# Patient Record
Sex: Male | Born: 1945 | Race: White | Hispanic: No | Marital: Married | Smoking: Former smoker
Health system: Southern US, Community
[De-identification: ages and names within clinical notes are randomized; demographics above are authoritative.]

## PROBLEM LIST (undated history)

## (undated) DIAGNOSIS — J449 Chronic obstructive pulmonary disease, unspecified: Secondary | ICD-10-CM

## (undated) DIAGNOSIS — Z8739 Personal history of other diseases of the musculoskeletal system and connective tissue: Secondary | ICD-10-CM

## (undated) DIAGNOSIS — M545 Low back pain, unspecified: Secondary | ICD-10-CM

## (undated) DIAGNOSIS — G8929 Other chronic pain: Secondary | ICD-10-CM

## (undated) DIAGNOSIS — I10 Essential (primary) hypertension: Secondary | ICD-10-CM

## (undated) DIAGNOSIS — K219 Gastro-esophageal reflux disease without esophagitis: Secondary | ICD-10-CM

## (undated) DIAGNOSIS — J189 Pneumonia, unspecified organism: Secondary | ICD-10-CM

## (undated) DIAGNOSIS — M1990 Unspecified osteoarthritis, unspecified site: Secondary | ICD-10-CM

## (undated) HISTORY — PX: JOINT REPLACEMENT: SHX530

---

## 1999-06-12 ENCOUNTER — Encounter: Payer: Self-pay | Admitting: Orthopedic Surgery

## 1999-06-12 ENCOUNTER — Ambulatory Visit (HOSPITAL_COMMUNITY): Admission: RE | Admit: 1999-06-12 | Discharge: 1999-06-12 | Payer: Self-pay | Admitting: Orthopedic Surgery

## 2004-09-27 ENCOUNTER — Emergency Department (HOSPITAL_COMMUNITY): Admission: EM | Admit: 2004-09-27 | Discharge: 2004-09-27 | Payer: Self-pay | Admitting: Family Medicine

## 2014-05-25 ENCOUNTER — Other Ambulatory Visit: Payer: Self-pay | Admitting: Gastroenterology

## 2014-06-06 ENCOUNTER — Encounter (HOSPITAL_COMMUNITY): Payer: Self-pay | Admitting: *Deleted

## 2014-06-26 ENCOUNTER — Ambulatory Visit (HOSPITAL_COMMUNITY): Payer: PPO | Admitting: Anesthesiology

## 2014-06-26 ENCOUNTER — Ambulatory Visit (HOSPITAL_COMMUNITY)
Admission: RE | Admit: 2014-06-26 | Discharge: 2014-06-26 | Disposition: A | Discharge status: Home / Self Care | Payer: PPO | Source: Ambulatory Visit | Attending: Gastroenterology | Admitting: Gastroenterology

## 2014-06-26 ENCOUNTER — Encounter (HOSPITAL_COMMUNITY): Payer: Self-pay | Admitting: Gastroenterology

## 2014-06-26 ENCOUNTER — Encounter (HOSPITAL_COMMUNITY)
Admission: RE | Disposition: A | Discharge status: Home / Self Care | Payer: Self-pay | Source: Ambulatory Visit | Attending: Gastroenterology

## 2014-06-26 DIAGNOSIS — M1990 Unspecified osteoarthritis, unspecified site: Secondary | ICD-10-CM | POA: Diagnosis not present

## 2014-06-26 DIAGNOSIS — I10 Essential (primary) hypertension: Secondary | ICD-10-CM | POA: Diagnosis not present

## 2014-06-26 DIAGNOSIS — J449 Chronic obstructive pulmonary disease, unspecified: Secondary | ICD-10-CM | POA: Diagnosis not present

## 2014-06-26 DIAGNOSIS — Z1211 Encounter for screening for malignant neoplasm of colon: Secondary | ICD-10-CM | POA: Insufficient documentation

## 2014-06-26 DIAGNOSIS — M109 Gout, unspecified: Secondary | ICD-10-CM | POA: Diagnosis not present

## 2014-06-26 DIAGNOSIS — Z87891 Personal history of nicotine dependence: Secondary | ICD-10-CM | POA: Insufficient documentation

## 2014-06-26 DIAGNOSIS — J45909 Unspecified asthma, uncomplicated: Secondary | ICD-10-CM | POA: Diagnosis not present

## 2014-06-26 HISTORY — DX: Gastro-esophageal reflux disease without esophagitis: K21.9

## 2014-06-26 HISTORY — DX: Essential (primary) hypertension: I10

## 2014-06-26 HISTORY — PX: COLONOSCOPY WITH PROPOFOL: SHX5780

## 2014-06-26 HISTORY — DX: Chronic obstructive pulmonary disease, unspecified: J44.9

## 2014-06-26 HISTORY — DX: Unspecified osteoarthritis, unspecified site: M19.90

## 2014-06-26 SURGERY — COLONOSCOPY WITH PROPOFOL
Anesthesia: Monitor Anesthesia Care

## 2014-06-26 MED ORDER — PROPOFOL 10 MG/ML IV BOLUS
INTRAVENOUS | Status: AC
  Filled 2014-06-26: qty 20

## 2014-06-26 MED ORDER — FENTANYL CITRATE 0.05 MG/ML IJ SOLN: 25.0000 ug | INTRAMUSCULAR | Status: DC | PRN

## 2014-06-26 MED ORDER — PROPOFOL INFUSION 10 MG/ML OPTIME
INTRAVENOUS | Status: DC | PRN
  Administered 2014-06-26: 300 ug/kg/min via INTRAVENOUS

## 2014-06-26 MED ORDER — LACTATED RINGERS IV SOLN
INTRAVENOUS | Status: DC
  Administered 2014-06-26: 1000 mL via INTRAVENOUS

## 2014-06-26 MED ORDER — SODIUM CHLORIDE 0.9 % IV SOLN: INTRAVENOUS | Status: DC

## 2014-06-26 SURGICAL SUPPLY — 21 items

## 2014-06-26 NOTE — Discharge Instructions (Signed)
Conscious Sedation, Adult, Care After °Refer to this sheet in the next few weeks. These instructions provide you with information on caring for yourself after your procedure. Your health care provider may also give you more specific instructions. Your treatment has been planned according to current medical practices, but problems sometimes occur. Call your health care provider if you have any problems or questions after your procedure. °WHAT TO EXPECT AFTER THE PROCEDURE  °After your procedure: °You may feel sleepy, clumsy, and have poor balance for several hours. °Vomiting may occur if you eat too soon after the procedure. °HOME CARE INSTRUCTIONS °Do not participate in any activities where you could become injured for at least 24 hours. Do not: °Drive. °Swim. °Ride a bicycle. °Operate heavy machinery. °Cook. °Use power tools. °Climb ladders. °Work from a high place. °Do not make important decisions or sign legal documents until you are improved. °If you vomit, drink water, juice, or soup when you can drink without vomiting. Make sure you have little or no nausea before eating solid foods. °Only take over-the-counter or prescription medicines for pain, discomfort, or fever as directed by your health care provider. °Make sure you and your family fully understand everything about the medicines given to you, including what side effects may occur. °You should not drink alcohol, take sleeping pills, or take medicines that cause drowsiness for at least 24 hours. °If you smoke, do not smoke without supervision. °If you are feeling better, you may resume normal activities 24 hours after you were sedated. °Keep all appointments with your health care provider. °SEEK MEDICAL CARE IF: °Your skin is pale or bluish in color. °You continue to feel nauseous or vomit. °Your pain is getting worse and is not helped by medicine. °You have bleeding or swelling. °You are still sleepy or feeling clumsy after 24 hours. °SEEK IMMEDIATE  MEDICAL CARE IF: °You develop a rash. °You have difficulty breathing. °You develop any type of allergic problem. °You have a fever. °MAKE SURE YOU: °Understand these instructions. °Will watch your condition. °Will get help right away if you are not doing well or get worse. °Document Released: 03/23/2013 Document Reviewed: 03/23/2013 °[employer]® Patient Information ©2015 [employer]. This information is not intended to replace advice given to you by your health care provider. Make sure you discuss any questions you have with your health care provider. °Colonoscopy, Care After °These instructions give you information on caring for yourself after your procedure. Your doctor may also give you more specific instructions. Call your doctor if you have any problems or questions after your procedure. °HOME CARE °· Do not drive for 24 hours. °· Do not sign important papers or use machinery for 24 hours. °· You may shower. °· You may go back to your usual activities, but go slower for the first 24 hours. °· Take rest breaks often during the first 24 hours. °· Walk around or use warm packs on your belly (abdomen) if you have belly cramping or gas. °· Drink enough fluids to keep your pee (urine) clear or pale yellow. °· Resume your normal diet. Avoid heavy or fried foods. °· Avoid drinking alcohol for 24 hours or as told by your doctor. °· Only take medicines as told by your doctor. °If a tissue sample (biopsy) was taken during the procedure:  °· Do not take aspirin or blood thinners for 7 days, or as told by your doctor. °· Do not drink alcohol for 7 days, or as told by your doctor. °· Eat   soft foods for the first 24 hours. °GET HELP IF: °You still have a small amount of blood in your poop (stool) 2-3 days after the procedure. °GET HELP RIGHT AWAY IF: °· You have more than a small amount of blood in your poop. °· You see clumps of tissue (blood clots) in your poop. °· Your belly is puffy (swollen). °· You feel sick to your  stomach (nauseous) or throw up (vomit). °· You have a fever. °· You have belly pain that gets worse and medicine does not help. °MAKE SURE YOU: °· Understand these instructions. °· Will watch your condition. °· Will get help right away if you are not doing well or get worse. °Document Released: 07/05/2010 Document Revised: 06/07/2013 Document Reviewed: 02/07/2013 °[employer]® Patient Information ©2015 [employer]. This information is not intended to replace advice given to you by your health care provider. Make sure you discuss any questions you have with your health care provider. ° °

## 2014-06-26 NOTE — H&P (Signed)
  Procedure: Baseline screening colonoscopy  History: The patient is a [age] year old male born [birth year]. He is scheduled to undergo his first screening colonoscopy with polypectomy to prevent colon cancer.  Medication allergies: Vicodin causes nausea  Past medical history: Osteoarthritis. Hypertension. Asthma. Gout.  Exam: The patient is alert and lying comfortably on the endoscopy stretcher. Abdomen is soft and nontender to palpation. Lungs are clear to auscultation. Cardiac exam reveals a regular rhythm.  Plan: Proceed with baseline screening colonoscopy

## 2014-06-26 NOTE — Transfer of Care (Signed)
Immediate Anesthesia Transfer of Care Note  Patient: Victor Fuller  Procedure(s) Performed: Procedure(s): COLONOSCOPY WITH PROPOFOL (N/A)  Patient Location: PACU and Endoscopy Unit  Anesthesia Type:MAC  Level of Consciousness: awake, alert , oriented and patient cooperative  Airway & Oxygen Therapy: Patient Spontanous Breathing and Patient connected to face mask oxygen  Post-op Assessment: Report given to PACU RN and Post -op Vital signs reviewed and stable  Post vital signs: Reviewed and stable  Complications: No apparent anesthesia complications

## 2014-06-26 NOTE — Anesthesia Postprocedure Evaluation (Signed)
  Anesthesia Post-op Note  Patient: Victor Fuller  Procedure(s) Performed: Procedure(s) (LRB): COLONOSCOPY WITH PROPOFOL (N/A)  Patient Location: PACU  Anesthesia Type: MAC  Level of Consciousness: awake and alert   Airway and Oxygen Therapy: Patient Spontanous Breathing  Post-op Pain: mild  Post-op Assessment: Post-op Vital signs reviewed, Patient's Cardiovascular Status Stable, Respiratory Function Stable, Patent Airway and No signs of Nausea or vomiting  Last Vitals:  Filed Vitals:   06/26/14 1205  BP:   Pulse: 86  Temp:   Resp: 13    Post-op Vital Signs: stable   Complications: No apparent anesthesia complications

## 2014-06-26 NOTE — Anesthesia Preprocedure Evaluation (Addendum)
Anesthesia Evaluation  Patient identified by MRN, date of birth, ID band Patient awake    Reviewed: Allergy & Precautions, H&P , NPO status , Patient's Chart, lab work & pertinent test results  Airway Mallampati: II  TM Distance: >3 FB Neck ROM: full    Dental  (+) Edentulous Upper, Edentulous Lower, Dental Advisory Given   Pulmonary COPDformer smoker,  breath sounds clear to auscultation  Pulmonary exam normal       Cardiovascular hypertension, Pt. on medications Rhythm:regular Rate:Normal     Neuro/Psych negative neurological ROS  negative psych ROS   GI/Hepatic negative GI ROS, Neg liver ROS,   Endo/Other  negative endocrine ROS  Renal/GU negative Renal ROS  negative genitourinary   Musculoskeletal   Abdominal   Peds  Hematology negative hematology ROS (+)   Anesthesia Other Findings   Reproductive/Obstetrics negative OB ROS                            Anesthesia Physical Anesthesia Plan  ASA: III  Anesthesia Plan: MAC   Post-op Pain Management:    Induction:   Airway Management Planned:   Additional Equipment:   Intra-op Plan:   Post-operative Plan:   Informed Consent: I have reviewed the patients History and Physical, chart, labs and discussed the procedure including the risks, benefits and alternatives for the proposed anesthesia with the patient or authorized representative who has indicated his/her understanding and acceptance.   Dental Advisory Given  Plan Discussed with: CRNA and Surgeon  Anesthesia Plan Comments:         Anesthesia Quick Evaluation

## 2014-06-26 NOTE — Op Note (Signed)
Procedure: Baseline screening colonoscopy  Endoscopist: Danise EdgeMartin Quantrell Splitt  Premedication: Propofol administered by anesthesia  Procedure: The patient was placed in the left lateral decubitus position. Anal inspection and digital rectal exam were normal. The Pentax pediatric colonoscope was introduced into the rectum and advanced to the cecum. A normal-appearing appendiceal orifice was identified. A normal-appearing ileocecal valve was intubated and the terminal ileum inspected. Colonic preparation for the exam today was good. Withdrawal time was 12 minutes  Rectum. Normal. Retroflexed view of the distal rectum normal  Sigmoid colon and descending colon. Normal  Splenic flexure. Normal  Transverse colon. Normal  Hepatic flexure. Normal  Ascending colon. Normal  Cecum and ileocecal valve. Normal  Terminal ileum. Normal  Assessment: Normal screening colonoscopy  Recommendation: Schedule repeat screening colonoscopy in 10 years

## 2014-06-27 ENCOUNTER — Encounter (HOSPITAL_COMMUNITY): Payer: Self-pay | Admitting: Gastroenterology

## 2014-08-24 ENCOUNTER — Ambulatory Visit (INDEPENDENT_AMBULATORY_CARE_PROVIDER_SITE_OTHER): Payer: PPO | Admitting: Podiatry

## 2014-08-24 ENCOUNTER — Ambulatory Visit: Payer: Self-pay

## 2014-08-24 DIAGNOSIS — M79671 Pain in right foot: Secondary | ICD-10-CM | POA: Diagnosis not present

## 2014-08-24 DIAGNOSIS — M779 Enthesopathy, unspecified: Secondary | ICD-10-CM

## 2014-08-24 MED ORDER — TRIAMCINOLONE ACETONIDE 10 MG/ML IJ SUSP
10.0000 mg | Freq: Once | INTRAMUSCULAR | Status: AC
  Administered 2014-08-24: 10 mg

## 2014-08-24 NOTE — Progress Notes (Signed)
   Subjective:    Patient ID: Victor Fuller, male    DOB: 15-Jun-1946, 69 y.o.   MRN: [id number]  HPI  Pt presents with right foot pain in lateral and medial aspect of ankle, worsens with prolonged standing or walking, has had orthotics, injections in the past which were successful in relieving his pain  Review of Systems  Musculoskeletal: Positive for back pain.  All other systems reviewed and are negative.      Objective:   Physical Exam        Assessment & Plan:

## 2014-08-25 NOTE — Progress Notes (Signed)
Subjective:     Patient ID: Victor Fuller, male   DOB: [birth year], 69 y.o.   MRN: [id number]  HPI patient presents stating that the medial side of his ankle is hurting and that the lateral side also hurts deep into the ankle. States that his foot is moderately flattened he's had a history of this problem and it makes it difficult at times for ambulation   Review of Systems  All other systems reviewed and are negative.      Objective:   Physical Exam  Constitutional: He is oriented to person, place, and time.  Cardiovascular: Intact distal pulses.   Musculoskeletal: Normal range of motion.  Neurological: He is oriented to person, place, and time.  Skin: Skin is warm.  Nursing note and vitals reviewed.  neurovascular status intact muscle strength adequate with range of motion within normal limits. I noted normal range of motion of the foot and I did not note any compromise in function of the posterior tibial tendon. There is quite a bit of discomfort at the posterior tibial insertion into the navicular right and also into the sinus tarsi right     Assessment:     Probable inflammatory posterior tibial tendinitis right with inflammation at the insertion along with possible sinus tarsitis right. With foot structure being a part of the process that creates the pain    Plan:     H&P and x-rays reviewed and today I injected the sheath of the posterior tibial tendon 3 mg Kenalog 5 mg Xylocaine and into the sinus tarsi right 3 mg Kenalog 5 mg Xylocaine Marcaine mixture. I applied fascial brace to lift and discussed long-term orthotics

## 2014-09-06 ENCOUNTER — Ambulatory Visit: Payer: PPO | Admitting: Podiatry

## 2014-09-11 ENCOUNTER — Ambulatory Visit (INDEPENDENT_AMBULATORY_CARE_PROVIDER_SITE_OTHER): Payer: PPO | Admitting: Podiatry

## 2014-09-11 ENCOUNTER — Encounter: Payer: Self-pay | Admitting: Podiatry

## 2014-09-11 VITALS — BP 114/74 | HR 90 | Resp 15

## 2014-09-11 DIAGNOSIS — M7661 Achilles tendinitis, right leg: Secondary | ICD-10-CM | POA: Diagnosis not present

## 2014-09-11 MED ORDER — TRIAMCINOLONE ACETONIDE 10 MG/ML IJ SUSP
10.0000 mg | Freq: Once | INTRAMUSCULAR | Status: AC
Start: 2014-09-11 — End: 2014-09-11
  Administered 2014-09-11: 10 mg

## 2014-09-12 NOTE — Progress Notes (Signed)
Subjective:     Patient ID: Victor Fuller, male   DOB: 12-06-1945, 69 y.o.   MRN: [id number]  HPI patient states I'm still having some pain in the bottom of my heel and also of the side of my tendon it's making it hard for me to be comfortable. Patient states that it does make walking difficult   Review of Systems     Objective:   Physical Exam Neurovascular status intact with muscle strength adequate range of motion within normal limits. Medial side of right Achilles there is a localized amount of inflammation that is present. The central and lateral band seems to be doing well. I also noted the posterior tib is still sore but improved and the lateral ankle is still tender    Assessment:     Achilles tendinitis right with improved posterior tibial tendinitis and sinus tarsitis    Plan:     Due to multiple problems we did scan him for orthotics today. I then went ahead and carefully on the medial side explaining chances of rupture and I carefully injected the medial area 3 mg dexamethasone Kenalog 5 mg Xylocaine and advised on physical therapy. Patient will be seen back for us to recheck again when orthotics returned or earlier if any issues should occur

## 2014-10-03 ENCOUNTER — Ambulatory Visit: Payer: PPO | Admitting: *Deleted

## 2014-10-03 DIAGNOSIS — M779 Enthesopathy, unspecified: Secondary | ICD-10-CM

## 2014-10-03 NOTE — Progress Notes (Signed)
Patient ID: Victor CoolsLarry F Fuller, male   DOB: 08-27-1945, 69 y.o.   MRN: [id number] PICKING UP INSERTS

## 2014-10-03 NOTE — Patient Instructions (Signed)

## 2014-11-28 ENCOUNTER — Encounter: Payer: Self-pay | Admitting: Podiatry

## 2014-11-28 ENCOUNTER — Ambulatory Visit (INDEPENDENT_AMBULATORY_CARE_PROVIDER_SITE_OTHER): Payer: PPO | Admitting: Podiatry

## 2014-11-28 VITALS — BP 144/79 | HR 101 | Resp 12

## 2014-11-28 DIAGNOSIS — M722 Plantar fascial fibromatosis: Secondary | ICD-10-CM

## 2014-11-28 MED ORDER — TRIAMCINOLONE ACETONIDE 10 MG/ML IJ SUSP
10.0000 mg | Freq: Once | INTRAMUSCULAR | Status: AC
  Administered 2014-11-28: 10 mg

## 2014-11-28 NOTE — Progress Notes (Signed)
   Subjective:    Patient ID: Victor Fuller, male    DOB: 07-24-1945, 69 y.o.   MRN: [id number]  HPI R plantar fascitis still bothering him as well as his achillis tendon on R foot now. Tendon has been bothering him for about 6 months now.   Review of Systems     Objective:   Physical Exam        Assessment & Plan:

## 2014-12-05 NOTE — Progress Notes (Signed)
Subjective:     Patient ID: Victor Fuller, male   DOB: [birth year], 69 y.o.   MRN: [id number]  HPI patient presents stating I have one area that still tender on the bottom of my right heel making it hard for me to wear shoe gear comfortably   Review of Systems     Objective:   Physical Exam Neurovascular status intact with pain plantar aspect right heel with reduction of pain from previous visit    Assessment:     Plantar fasciitis right improving but still present    Plan:     H&P and condition reviewed with patient. Today I reinjected the plantar fascia 3 mg Kenalog 5 mg Xylocaine and instructed on physical therapy. Reappoint for [place] to recheck

## 2015-07-26 ENCOUNTER — Encounter: Payer: Self-pay | Admitting: Podiatry

## 2015-07-26 ENCOUNTER — Ambulatory Visit (INDEPENDENT_AMBULATORY_CARE_PROVIDER_SITE_OTHER): Payer: PPO

## 2015-07-26 ENCOUNTER — Ambulatory Visit (INDEPENDENT_AMBULATORY_CARE_PROVIDER_SITE_OTHER): Payer: PPO | Admitting: Podiatry

## 2015-07-26 VITALS — BP 117/86 | HR 66 | Resp 12

## 2015-07-26 DIAGNOSIS — M79671 Pain in right foot: Secondary | ICD-10-CM

## 2015-07-26 DIAGNOSIS — M779 Enthesopathy, unspecified: Secondary | ICD-10-CM

## 2015-07-26 DIAGNOSIS — M7661 Achilles tendinitis, right leg: Secondary | ICD-10-CM | POA: Diagnosis not present

## 2015-07-26 MED ORDER — TRIAMCINOLONE ACETONIDE 10 MG/ML IJ SUSP
10.0000 mg | Freq: Once | INTRAMUSCULAR | Status: AC
  Administered 2015-07-26: 10 mg

## 2015-07-26 NOTE — Progress Notes (Signed)
Subjective:     Patient ID: Victor Fuller, male   DOB: 12-13-1945, 70 y.o.   MRN: [id number]  HPI patient states my heels feeling pretty good but I'm getting pain around the inside of the ankle and it's making it hard to walk distances   Review of Systems     Objective:   Physical Exam Neurovascular status intact muscle strength adequate with discomfort in the plantar heel which is moderated quite a bit and quite a bit of discomfort in the posterior tibial insertion right and as it comes underneath the medial malleolus with no indication of muscle loss strength    Assessment:     Probable posterior tibial tendinitis which may have been compensatory in nature    Plan:     Sheath injection administered 3 mg Kenalog 5 mg Xylocaine advised on physical therapy supportive shoe gear usage and the fact this may ultimately require boot usage. Reappoint to recheck as needed

## 2015-08-06 ENCOUNTER — Ambulatory Visit (INDEPENDENT_AMBULATORY_CARE_PROVIDER_SITE_OTHER): Payer: PPO | Admitting: Podiatry

## 2015-08-06 DIAGNOSIS — M779 Enthesopathy, unspecified: Secondary | ICD-10-CM

## 2015-08-06 DIAGNOSIS — M7661 Achilles tendinitis, right leg: Secondary | ICD-10-CM

## 2015-08-06 MED ORDER — TRIAMCINOLONE ACETONIDE 10 MG/ML IJ SUSP
10.0000 mg | Freq: Once | INTRAMUSCULAR | Status: AC
  Administered 2015-08-06: 10 mg

## 2015-08-07 NOTE — Progress Notes (Signed)
Subjective:     Patient ID: Victor Fuller, male   DOB: [birth year], 70 y.o.   MRN: [id number]  HPI patient states it is still hurting and I've admitted I'm not been wearing the boot as I was supposed   Review of Systems     Objective:   Physical Exam  neurovascular status intact muscle strength adequate with patient's pain no longer significant in the medial tibial complex but is quite sore in the sinus tarsi    Assessment:      inflammatory sinus tarsitis right with improved medial tendinitis    Plan:      at this time injected sinus tarsi right 3 mg Kenalog 5 mg Xylocaine and instructed on physical therapy. Reappoint to recheck

## 2015-08-09 ENCOUNTER — Ambulatory Visit: Payer: PPO | Admitting: Podiatry

## 2015-08-27 ENCOUNTER — Encounter: Payer: Self-pay | Admitting: Podiatry

## 2015-08-27 ENCOUNTER — Ambulatory Visit (INDEPENDENT_AMBULATORY_CARE_PROVIDER_SITE_OTHER): Payer: PPO | Admitting: Podiatry

## 2015-08-27 VITALS — BP 130/80 | HR 123 | Resp 16

## 2015-08-27 DIAGNOSIS — M779 Enthesopathy, unspecified: Secondary | ICD-10-CM | POA: Diagnosis not present

## 2015-08-27 NOTE — Progress Notes (Signed)
Subjective:     Patient ID: Victor Fuller, male   DOB: 1945/11/26, 70 y.o.   MRN: [id number]  HPI patient presents with continued concerns about pain in his right ankle. States that he seems some better at times but when he weightbears on a quite a bit it seems to get sore   Review of Systems     Objective:   Physical Exam Neurovascular status intact with diminished discomfort in the lateral side of the sinus tarsi but quite a bit of discomfort still in the proximal portion of the posterior tib just before it comes under the medial malleolus. No indication of muscle strength loss    Assessment:     Continue tendinitis right    Plan:     We will bring his orthotics in for melody to evaluate him we may end up having to make him some kind of an AFO brace if symptoms persist but at this time it'll continue with ice and we will try to modify his orthotic devices

## 2015-09-07 ENCOUNTER — Ambulatory Visit (INDEPENDENT_AMBULATORY_CARE_PROVIDER_SITE_OTHER): Payer: PPO | Admitting: Podiatry

## 2015-09-07 DIAGNOSIS — M779 Enthesopathy, unspecified: Secondary | ICD-10-CM

## 2015-09-07 MED ORDER — TRIAMCINOLONE ACETONIDE 10 MG/ML IJ SUSP
10.0000 mg | Freq: Once | INTRAMUSCULAR | Status: AC
  Administered 2015-09-07: 10 mg

## 2015-09-09 NOTE — Progress Notes (Signed)
Subjective:     Patient ID: Victor Fuller, male   DOB: 10/31/1945, [age] y.o.   MRN: [id number]  HPI patient states I'm doing okay but I'm getting a lot of discomfort in this joint surface   Review of Systems     Objective:   Physical Exam Neurovascular status intact muscle strength adequate with patient having discomfort in the second MPJ with fluid buildup    Assessment:     Inflammatory capsulitis noted    Plan:     Were to focus on the acuteness and we are working on her orthotics and today I went ahead did a proximal block explained risk of aspiration and carefully took out a small amount of clear fluid and injected corner cc deck some some Kenalog and applied thick padding. Reappoint to recheck

## 2015-10-01 ENCOUNTER — Encounter: Payer: Self-pay | Admitting: Podiatry

## 2015-10-01 ENCOUNTER — Ambulatory Visit (INDEPENDENT_AMBULATORY_CARE_PROVIDER_SITE_OTHER): Payer: PPO | Admitting: Podiatry

## 2015-10-01 DIAGNOSIS — M722 Plantar fascial fibromatosis: Secondary | ICD-10-CM

## 2015-10-01 DIAGNOSIS — M7661 Achilles tendinitis, right leg: Secondary | ICD-10-CM | POA: Diagnosis not present

## 2015-10-01 DIAGNOSIS — M779 Enthesopathy, unspecified: Secondary | ICD-10-CM | POA: Diagnosis not present

## 2015-10-02 ENCOUNTER — Other Ambulatory Visit: Payer: PPO

## 2015-10-02 NOTE — Progress Notes (Signed)
Subjective:     Patient ID: Victor Fuller, male   DOB: [birth year], 70 y.o.   MRN: [id number]  HPI patient states I'm still having a lot of pain with this heel and I have to wear my boot at all times or it just seems to flareup   Review of Systems     Objective:   Physical Exam Has had over a 1 year history of this problem with numerous conservative treatments been attempted at this time    Assessment:     Neurovascular status still intact to the same level with pain in the posterior heel plantar heel right that's failed to respond with pain also now the lateral ankle and around the posterior tibial tendon    Plan:     Chronic fasciitis with Achilles tendinitis occurring creating inflammatory changes and probable compensatory pain. Discussed long-term shockwave and patient will have shockwave therapy performed

## 2015-10-23 ENCOUNTER — Other Ambulatory Visit: Payer: PPO

## 2015-10-30 DIAGNOSIS — E559 Vitamin D deficiency, unspecified: Secondary | ICD-10-CM | POA: Diagnosis not present

## 2015-10-30 DIAGNOSIS — M189 Osteoarthritis of first carpometacarpal joint, unspecified: Secondary | ICD-10-CM | POA: Diagnosis not present

## 2015-10-30 DIAGNOSIS — Z0001 Encounter for general adult medical examination with abnormal findings: Secondary | ICD-10-CM | POA: Diagnosis not present

## 2015-10-30 DIAGNOSIS — R05 Cough: Secondary | ICD-10-CM | POA: Diagnosis not present

## 2015-10-30 DIAGNOSIS — Z79899 Other long term (current) drug therapy: Secondary | ICD-10-CM | POA: Diagnosis not present

## 2015-10-30 DIAGNOSIS — Z125 Encounter for screening for malignant neoplasm of prostate: Secondary | ICD-10-CM | POA: Diagnosis not present

## 2015-10-30 DIAGNOSIS — M109 Gout, unspecified: Secondary | ICD-10-CM | POA: Diagnosis not present

## 2015-10-30 DIAGNOSIS — E538 Deficiency of other specified B group vitamins: Secondary | ICD-10-CM | POA: Diagnosis not present

## 2015-10-30 DIAGNOSIS — I10 Essential (primary) hypertension: Secondary | ICD-10-CM | POA: Diagnosis not present

## 2015-11-07 ENCOUNTER — Ambulatory Visit (INDEPENDENT_AMBULATORY_CARE_PROVIDER_SITE_OTHER): Payer: PPO

## 2015-11-07 DIAGNOSIS — M722 Plantar fascial fibromatosis: Secondary | ICD-10-CM

## 2015-11-07 NOTE — Progress Notes (Signed)
   Subjective:    Patient ID: Victor Fuller, male    DOB: March 23, 1946, 70 y.o.   MRN: [id number]  HPI  Pt presents stating that he has pain in his right heel and on the inside of his right ankle  Review of Systems    All other systems negative Objective:   Physical Exam  Pain upon palpation of medial heel and medial aspect of ankle      Assessment & Plan:  ESWR treatment administered to the Rt heel at 25 joules 0.30 energy for 3000 pulses. EPAT delivered to medial ankle/peroneal at 15hz  3.0 energy for 2000 pulses.EPAT also delivered to surrounding connective tissues for 3000 pulses. Pt tolerated procedure well, with intermittent pain throughout but never exceeding 7 of 10. Advised against use of NSAIDs and ice. Re-appointed in 1 week for 2nd treatment

## 2015-11-14 ENCOUNTER — Ambulatory Visit: Payer: PPO

## 2015-11-14 DIAGNOSIS — M722 Plantar fascial fibromatosis: Secondary | ICD-10-CM

## 2015-11-15 NOTE — Progress Notes (Signed)
   Subjective:    Patient ID: Victor Fuller, male    DOB: November 30, 1945, 70 y.o.   MRN: [id number]  HPI  Pt presents stating that he has pain in his right heel and on the inside of his right ankle  Review of Systems    All other systems negative Objective:   Physical Exam  Pain upon palpation of medial heel and medial aspect of ankle      Assessment & Plan:  ESWR treatment administered to the Rt heel at 21 joules 0.30 energy for 3000 pulses. EPAT delivered to medial ankle/peroneal at 11joules 3.0 energy for 2000 pulses.EPAT also delivered to surrounding connective tissues for 3000 pulses. Pt tolerated procedure well, with intermittent pain throughout but never exceeding 7 of 10. Advised against use of NSAIDs and ice. Re-appointed in 1 week for 3rd treatment

## 2015-11-21 ENCOUNTER — Ambulatory Visit: Payer: PPO

## 2015-11-21 DIAGNOSIS — M722 Plantar fascial fibromatosis: Secondary | ICD-10-CM

## 2015-11-21 NOTE — Progress Notes (Signed)
   Subjective:    Patient ID: Victor Fuller, male    DOB: [birth year], 70 y.o.   MRN: [id number]  HPI  Pt presents stating that he has pain in his right heel and on the inside of his right ankle  Review of Systems    All other systems negative Objective:   Physical Exam  Pain upon palpation of medial heel and medial aspect of ankle      Assessment & Plan:  ESWR treatment administered to the Rt heel at 21 joules 0.40 energy for 3000 pulses. EPAT delivered to medial ankle/peroneal at 11joules 3.0 energy for 2000 pulses.EPAT also delivered to surrounding connective tissues for 3000 pulses. Pt tolerated procedure well, with intermittent pain throughout but never exceeding 7 of 10. Advised against use of NSAIDs and ice. Re-appointed in 4 weeks for re-evaluation

## 2015-12-19 ENCOUNTER — Encounter: Payer: Self-pay | Admitting: Podiatry

## 2015-12-19 ENCOUNTER — Ambulatory Visit (INDEPENDENT_AMBULATORY_CARE_PROVIDER_SITE_OTHER): Payer: PPO | Admitting: Podiatry

## 2015-12-19 DIAGNOSIS — M779 Enthesopathy, unspecified: Secondary | ICD-10-CM | POA: Diagnosis not present

## 2015-12-19 DIAGNOSIS — M7661 Achilles tendinitis, right leg: Secondary | ICD-10-CM

## 2015-12-19 MED ORDER — TRIAMCINOLONE ACETONIDE 10 MG/ML IJ SUSP
10.0000 mg | Freq: Once | INTRAMUSCULAR | Status: AC
  Administered 2015-12-19: 10 mg

## 2015-12-19 NOTE — Progress Notes (Signed)
Subjective:     Patient ID: Victor Fuller, male   DOB: 1945-07-31, 70 y.o.   MRN: [id number]  HPI patient presents stating my foot is doing pretty well on the bottom of the side but I'm getting some discomfort in the outside of the ankle and slight discomfort on the inside   Review of Systems     Objective:   Physical Exam Neurovascular status intact with mild discomfort in the posterior tib tendon as it comes under the medial malleolus and also mild discomfort in the Achilles with no plantar pain and quite a bit of discomfort in the sinus tarsi    Assessment:     Inflammatory sinus tarsitis right which may be compensatory and discomfort in the left foot with improvement of the areas we have been doing shockwave    Plan:     Injected the sinus tarsi 3 mg Kenalog 5 mg Xylocaine and on the posterior tib into the Achilles ankle we did do shockwave

## 2016-01-30 ENCOUNTER — Encounter: Payer: Self-pay | Admitting: Podiatry

## 2016-01-30 ENCOUNTER — Ambulatory Visit (INDEPENDENT_AMBULATORY_CARE_PROVIDER_SITE_OTHER): Payer: PPO | Admitting: Podiatry

## 2016-01-30 DIAGNOSIS — M722 Plantar fascial fibromatosis: Secondary | ICD-10-CM | POA: Diagnosis not present

## 2016-01-30 DIAGNOSIS — M7661 Achilles tendinitis, right leg: Secondary | ICD-10-CM

## 2016-01-30 MED ORDER — TRIAMCINOLONE ACETONIDE 10 MG/ML IJ SUSP
10.0000 mg | Freq: Once | INTRAMUSCULAR | Status: AC
  Administered 2016-01-30: 10 mg

## 2016-01-30 NOTE — Progress Notes (Signed)
Subjective:     Patient ID: Victor Fuller, male   DOB: 1945-07-23, 70 y.o.   MRN: [id number]  HPI patient states that my foot is somewhat better but I'm still getting pain under and on the side and I am concerned that it's just not the way I want to be. It's worse when I get up after being on it all day and also I'm wondering if I need to scan   Review of Systems     Objective:   Physical Exam Neurovascular status intact muscle strength adequate and inflammation of a mild nature in the plantar fascia and mild to moderate nature in the medial aspect of the Achilles with minimal lateral pain noted currently    Assessment:     Inflammatory changes with fasciitis symptoms chronic in nature with Achilles tendinitis which may be its own separate problems or compensation with lateral foot tendinitis compensation    Plan:     Reviewed conditions and injected the plantar fascia today 3 mg Kenalog 5 mg Xylocaine and went ahead fibular shockwave of the gluteal Achilles right and reappoint in several weeks at the end of the day's we can better determine what his issues are

## 2016-02-20 ENCOUNTER — Ambulatory Visit (INDEPENDENT_AMBULATORY_CARE_PROVIDER_SITE_OTHER): Payer: PPO | Admitting: Podiatry

## 2016-02-20 ENCOUNTER — Encounter: Payer: Self-pay | Admitting: Podiatry

## 2016-02-20 DIAGNOSIS — M779 Enthesopathy, unspecified: Secondary | ICD-10-CM | POA: Diagnosis not present

## 2016-02-20 DIAGNOSIS — M722 Plantar fascial fibromatosis: Secondary | ICD-10-CM

## 2016-02-20 MED ORDER — TRIAMCINOLONE ACETONIDE 10 MG/ML IJ SUSP
10.0000 mg | Freq: Once | INTRAMUSCULAR | Status: AC
  Administered 2016-02-20: 10 mg

## 2016-02-21 NOTE — Progress Notes (Signed)
Subjective:     Patient ID: Victor Fuller, male   DOB: 1945-12-19, 70 y.o.   MRN: [id number]  HPI patient states she's been doing pretty good in the heel but it seems like slightly higher on the medial side there continues to be an area of discomfort with shooting pain   Review of Systems     Objective:   Physical Exam Neurovascular status intact with patient's plantar heel doing better posterior Achilles doing better but pain in the canal where there may be a slight entrapment of the posterior tibial tendon even though I could not get a positive Tinel's sign or indication of nerve entrapment    Assessment:     More proximal tendinitis with possibility for nerve irritation    Plan:     Proximal injection administered 3 [place] Kenalog 5 mg Xylocaine advised on heat ice and support and reappoint to recheck

## 2016-03-19 ENCOUNTER — Encounter: Payer: Self-pay | Admitting: Podiatry

## 2016-03-19 ENCOUNTER — Ambulatory Visit (INDEPENDENT_AMBULATORY_CARE_PROVIDER_SITE_OTHER): Payer: PPO | Admitting: Podiatry

## 2016-03-19 DIAGNOSIS — M722 Plantar fascial fibromatosis: Secondary | ICD-10-CM

## 2016-03-19 NOTE — Patient Instructions (Signed)
Pre-Operative Instructions  Congratulations, you have decided to take an important step to improving your quality of life.  You can be assured that the doctors of [facility] will be with you every step of the way.  1. Plan to be at the surgery center/hospital at least 1 (one) hour prior to your scheduled time unless otherwise directed by the surgical center/hospital staff.  You must have a responsible adult accompany you, remain during the surgery and drive you home.  Make sure you have directions to the surgical center/hospital and know how to get there on time. 2. For hospital based surgery you will need to obtain a history and physical form from your family physician within 1 month prior to the date of surgery- we will give you a form for you primary physician.  3. We make every effort to accommodate the date you request for surgery.  There are however, times where surgery dates or times have to be moved.  We will contact you as soon as possible if a change in schedule is required.   4. No Aspirin/Ibuprofen for one week before surgery.  If you are on aspirin, any non-steroidal anti-inflammatory medications (Mobic, Aleve, Ibuprofen) you should stop taking it 7 days prior to your surgery.  You make take Tylenol  For pain prior to surgery.  5. Medications- If you are taking daily heart and blood pressure medications, seizure, reflux, allergy, asthma, anxiety, pain or diabetes medications, make sure the surgery center/hospital is aware before the day of surgery so they may notify you which medications to take or avoid the day of surgery. 6. No food or drink after midnight the night before surgery unless directed otherwise by surgical center/hospital staff. 7. No alcoholic beverages 24 hours prior to surgery.  No smoking 24 hours prior to or 24 hours after surgery. 8. Wear loose pants or shorts- loose enough to fit over bandages, boots, and casts. 9. No slip on shoes, sneakers are best. 10. Bring  your boot with you to the surgery center/hospital.  Also bring crutches or a walker if your physician has prescribed it for you.  If you do not have this equipment, it will be provided for you after surgery. 11. If you have not been contracted by the surgery center/hospital by the day before your surgery, call to confirm the date and time of your surgery. 12. Leave-time from work may vary depending on the type of surgery you have.  Appropriate arrangements should be made prior to surgery with your employer. 13. Prescriptions will be provided immediately following surgery by your doctor.  Have these filled as soon as possible after surgery and take the medication as directed. 14. Remove nail polish on the operative foot. 15. Wash the night before surgery.  The night before surgery wash the foot and leg well with the antibacterial soap provided and water paying special attention to beneath the toenails and in between the toes.  Rinse thoroughly with water and dry well with a towel.  Perform this wash unless told not to do so by your physician.  Enclosed: 1 Ice pack (please put in freezer the night before surgery)   1 Hibiclens skin cleaner   Pre-op Instructions  If you have any questions regarding the instructions, do not hesitate to call our office.  [place]: [address] , [place] [address] [phone]  [place]: [address] , [place] [address] [phone]  [place]: [address]  [place], [place] [address] [phone]   Dr.   Rishi Vicario DPM, Dr. Matthew Wagoner DPM, Dr. M. Todd Hyatt DPM, Dr. Titorya Stover DPM 

## 2016-03-20 NOTE — Progress Notes (Signed)
Subjective:     Patient ID: Victor Fuller, male   DOB: [birth year], 70 y.o.   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 At this point due to this long-term history and failure to respond to numerous conservative treatments I do think would be best to correct and I explained procedure and risk. Patient wants surgery and today I went ahead and I allowed him to read a consent form going over endoscopic release of the medial fascial band right and the fact there is absolutely no long-term guarantees this will correct the other problems he has. He is willing to accept this risk wants surgery signs consent form is given all preoperative instructions and is scheduled in the next 3 weeks for surgery. He is encouraged to call with any questions prior to procedure

## 2016-04-16 HISTORY — PX: ACHILLES TENDON SURGERY: SHX542

## 2016-04-29 ENCOUNTER — Encounter: Payer: Self-pay | Admitting: Podiatry

## 2016-04-29 DIAGNOSIS — I10 Essential (primary) hypertension: Secondary | ICD-10-CM | POA: Diagnosis not present

## 2016-04-29 DIAGNOSIS — M722 Plantar fascial fibromatosis: Secondary | ICD-10-CM | POA: Diagnosis not present

## 2016-05-02 NOTE — Progress Notes (Signed)
DOS 11.14.2017 Endoscopic Release Med. Band Right Heel

## 2016-05-07 ENCOUNTER — Ambulatory Visit (INDEPENDENT_AMBULATORY_CARE_PROVIDER_SITE_OTHER): Payer: PPO | Admitting: Podiatry

## 2016-05-07 DIAGNOSIS — M722 Plantar fascial fibromatosis: Secondary | ICD-10-CM

## 2016-05-07 DIAGNOSIS — Z9889 Other specified postprocedural states: Secondary | ICD-10-CM

## 2016-05-07 DIAGNOSIS — M779 Enthesopathy, unspecified: Secondary | ICD-10-CM

## 2016-05-07 NOTE — Progress Notes (Signed)
Subjective:     Patient ID: Victor Fuller, male   DOB: May 18, 1946, 70 y.o.   MRN: [id number]  HPI patient states that he is doing really well with his surgery and walking without pain   Review of Systems     Objective:   Physical Exam Neurovascular status intact negative Homans sign was noted with well coapted incision site medial lateral of the right heel with wound edges well coapted stitches in place    Assessment:     Doing well post endoscopic surgery right heel    Plan:     Sterile dressing reapplied and instructed on anti-inflammatories and instructed on continued boot usage for 2 more weeks. Reappoint 2 weeks or earlier if needed

## 2016-05-21 ENCOUNTER — Ambulatory Visit (INDEPENDENT_AMBULATORY_CARE_PROVIDER_SITE_OTHER): Payer: PPO | Admitting: Podiatry

## 2016-05-21 DIAGNOSIS — M722 Plantar fascial fibromatosis: Secondary | ICD-10-CM | POA: Diagnosis not present

## 2016-05-21 DIAGNOSIS — M779 Enthesopathy, unspecified: Secondary | ICD-10-CM

## 2016-05-21 MED ORDER — TRIAMCINOLONE ACETONIDE 10 MG/ML IJ SUSP
10.0000 mg | Freq: Once | INTRAMUSCULAR | Status: AC
  Administered 2016-05-21: 10 mg

## 2016-05-22 NOTE — Progress Notes (Signed)
Subjective:     Patient ID: Victor Fuller, male   DOB: 1945-10-06, 70 y.o.   MRN: [id number]  HPI patient presents stating my heel seems really good but he's having pain in the outside of his ankle that he thinks if he could get better than it we will make his life much better   Review of Systems     Objective:   Physical Exam Neurovascular status intact with patient found to have inflammation in the sinus tarsi right with fluid buildup and is noted to have incision sites are well-healed medial lateral plantar heel right    Assessment:     Doing well post endoscopic surgery right with inflammatory sinus tarsitis right    Plan:     Injected the sinus tarsi right 3 mg, comes Marcaine removed stitches applied sterile dressing and instructed on continued immobilization and reappoint 4 weeks or earlier if needed

## 2016-05-23 ENCOUNTER — Encounter: Payer: Self-pay | Admitting: Podiatry

## 2016-07-02 ENCOUNTER — Ambulatory Visit: Payer: PPO

## 2016-07-04 ENCOUNTER — Ambulatory Visit (INDEPENDENT_AMBULATORY_CARE_PROVIDER_SITE_OTHER): Payer: PPO | Admitting: Podiatry

## 2016-07-04 ENCOUNTER — Encounter: Payer: Self-pay | Admitting: Podiatry

## 2016-07-04 VITALS — BP 112/80 | HR 121 | Resp 16

## 2016-07-04 DIAGNOSIS — M779 Enthesopathy, unspecified: Secondary | ICD-10-CM

## 2016-07-04 MED ORDER — TRIAMCINOLONE ACETONIDE 10 MG/ML IJ SUSP
10.0000 mg | Freq: Once | INTRAMUSCULAR | Status: AC
  Administered 2016-07-04: 10 mg

## 2016-07-06 NOTE — Progress Notes (Signed)
Subjective:     Patient ID: Victor Fuller, male   DOB: June 22, 1945, 71 y.o.   MRN: [id number]  HPI patient states my heels doing great but I seem to have developed a new pain on top of my right ankle with the outside of the ankle doing better also   Review of Systems     Objective:   Physical Exam Neurovascular status intact negative Homans sign noted with patient's incision sites healing very well with patient's pain no longer in the heel bone on the dorsal medial aspect of the right ankle    Assessment:     I'm very hopeful as he starts to walk normally that the inflammatory cycle will reduce but at this time I did go ahead and I injected the dorsal anterior ankle 3 mg Kenalog 5 mill grams Xylocaine advised on physical therapy and range of motion exercises    Plan:     Reviewed again change in gait and see back depending on symptoms

## 2016-07-15 ENCOUNTER — Telehealth: Payer: Self-pay | Admitting: *Deleted

## 2016-07-15 DIAGNOSIS — M779 Enthesopathy, unspecified: Secondary | ICD-10-CM

## 2016-07-15 NOTE — Telephone Encounter (Addendum)
Pt presents to office requesting an MRI of right ankle prior to next appt 08/08/2016, pt complains of crunching and pain in the right ankle especially when in shoes, and thinks it would give Dr. Charlsie Merlesegal more information to go on. I told pt I would inform Dr. Charlsie Merlesegal of his request and call again tomorrow. Pt states use [phone]. 07/16/2016-Informed pt Dr. Charlsie Merlesegal ordered MRI of right ankle, [facility] [phone]. [facility] and gave orders to [place] [place] for [employer].

## 2016-07-16 ENCOUNTER — Other Ambulatory Visit: Payer: Self-pay | Admitting: Podiatry

## 2016-07-16 DIAGNOSIS — Z77018 Contact with and (suspected) exposure to other hazardous metals: Secondary | ICD-10-CM

## 2016-07-16 NOTE — Telephone Encounter (Signed)
fine

## 2016-07-28 ENCOUNTER — Ambulatory Visit
Admission: RE | Admit: 2016-07-28 | Discharge: 2016-07-28 | Disposition: A | Discharge status: Home / Self Care | Payer: PPO | Source: Ambulatory Visit | Attending: Podiatry | Admitting: Podiatry

## 2016-07-28 DIAGNOSIS — Z77018 Contact with and (suspected) exposure to other hazardous metals: Secondary | ICD-10-CM

## 2016-07-28 DIAGNOSIS — Z01818 Encounter for other preprocedural examination: Secondary | ICD-10-CM | POA: Diagnosis not present

## 2016-07-28 DIAGNOSIS — S86811A Strain of other muscle(s) and tendon(s) at lower leg level, right leg, initial encounter: Secondary | ICD-10-CM | POA: Diagnosis not present

## 2016-08-08 ENCOUNTER — Ambulatory Visit (INDEPENDENT_AMBULATORY_CARE_PROVIDER_SITE_OTHER): Payer: PPO | Admitting: Podiatry

## 2016-08-08 DIAGNOSIS — M19079 Primary osteoarthritis, unspecified ankle and foot: Secondary | ICD-10-CM | POA: Diagnosis not present

## 2016-08-08 DIAGNOSIS — M2140 Flat foot [pes planus] (acquired), unspecified foot: Secondary | ICD-10-CM | POA: Diagnosis not present

## 2016-08-08 DIAGNOSIS — M76829 Posterior tibial tendinitis, unspecified leg: Secondary | ICD-10-CM

## 2016-08-11 NOTE — Progress Notes (Signed)
Subjective:     Patient ID: Victor CoolsLarry F Fuller, male   DOB: 08/14/1945, 71 y.o.   MRN: [id number]  HPI patient presents with severe discomfort in his right ankle that makes it difficult to walk comfortably and he's tried different treatment options without relief including endoscopic release of the fascial band which she states is made a big difference for him plantar but is not helping his ankle   Review of Systems     Objective:   Physical Exam Neurovascular status intact negative Homans sign was noted with well-healed surgical site medial lateral right heel with significant diminishment of discomfort within the medial fascial band but does have quite a bit of pain in the ankle    Assessment:     Continued discomfort of the right ankle consistent with arthritis with well-healed surgical sites plantar right    Plan:     H&P conditions reviewed and recommended anti-inflammatories and the fact that he may require bracing in order to provide for stability the ankle as we will try to do anything we can do avoid any kind of ankle fusion. Patient wants bracing and is scheduled for bracing therapy

## 2016-08-13 ENCOUNTER — Other Ambulatory Visit: Payer: PPO | Admitting: *Deleted

## 2016-09-10 ENCOUNTER — Ambulatory Visit: Payer: PPO | Admitting: *Deleted

## 2016-09-10 DIAGNOSIS — M19079 Primary osteoarthritis, unspecified ankle and foot: Secondary | ICD-10-CM | POA: Diagnosis not present

## 2016-09-10 DIAGNOSIS — M2140 Flat foot [pes planus] (acquired), unspecified foot: Secondary | ICD-10-CM | POA: Diagnosis not present

## 2016-09-29 NOTE — Progress Notes (Unsigned)
Patient picked  Up AFO Rudi Heap).Marland KitchenMarland KitchenPatient satisfied with fit and function of brace.  Brace controls subtalar motion in more than one plane (frontal/transverse/sagittal).  Brace helps facilitate balance during gait.   Patient had no complaints and was advised of donning/wearing instructions.    Dr Charlsie Merles to drop charges on [phone] (R) and [phone] (R) and [id number] (R)

## 2016-11-03 DIAGNOSIS — I10 Essential (primary) hypertension: Secondary | ICD-10-CM | POA: Diagnosis not present

## 2016-11-03 DIAGNOSIS — Z125 Encounter for screening for malignant neoplasm of prostate: Secondary | ICD-10-CM | POA: Diagnosis not present

## 2016-11-03 DIAGNOSIS — M19071 Primary osteoarthritis, right ankle and foot: Secondary | ICD-10-CM | POA: Diagnosis not present

## 2016-11-03 DIAGNOSIS — Z0000 Encounter for general adult medical examination without abnormal findings: Secondary | ICD-10-CM | POA: Diagnosis not present

## 2016-11-03 DIAGNOSIS — M109 Gout, unspecified: Secondary | ICD-10-CM | POA: Diagnosis not present

## 2016-11-03 DIAGNOSIS — E559 Vitamin D deficiency, unspecified: Secondary | ICD-10-CM | POA: Diagnosis not present

## 2016-11-03 DIAGNOSIS — R05 Cough: Secondary | ICD-10-CM | POA: Diagnosis not present

## 2016-11-03 DIAGNOSIS — Z79899 Other long term (current) drug therapy: Secondary | ICD-10-CM | POA: Diagnosis not present

## 2016-11-03 DIAGNOSIS — E538 Deficiency of other specified B group vitamins: Secondary | ICD-10-CM | POA: Diagnosis not present

## 2016-11-03 DIAGNOSIS — Z1389 Encounter for screening for other disorder: Secondary | ICD-10-CM | POA: Diagnosis not present

## 2016-11-03 DIAGNOSIS — Z7189 Other specified counseling: Secondary | ICD-10-CM | POA: Diagnosis not present

## 2016-11-03 DIAGNOSIS — M189 Osteoarthritis of first carpometacarpal joint, unspecified: Secondary | ICD-10-CM | POA: Diagnosis not present

## 2016-12-29 DIAGNOSIS — M19071 Primary osteoarthritis, right ankle and foot: Secondary | ICD-10-CM | POA: Diagnosis not present

## 2016-12-29 DIAGNOSIS — G8929 Other chronic pain: Secondary | ICD-10-CM | POA: Diagnosis not present

## 2016-12-29 DIAGNOSIS — M25571 Pain in right ankle and joints of right foot: Secondary | ICD-10-CM | POA: Diagnosis not present

## 2017-01-05 ENCOUNTER — Other Ambulatory Visit: Payer: Self-pay | Admitting: Orthopedic Surgery

## 2017-01-22 DIAGNOSIS — H524 Presbyopia: Secondary | ICD-10-CM | POA: Diagnosis not present

## 2017-01-22 DIAGNOSIS — H52223 Regular astigmatism, bilateral: Secondary | ICD-10-CM | POA: Diagnosis not present

## 2017-01-22 DIAGNOSIS — H25093 Other age-related incipient cataract, bilateral: Secondary | ICD-10-CM | POA: Diagnosis not present

## 2017-01-22 DIAGNOSIS — H5203 Hypermetropia, bilateral: Secondary | ICD-10-CM | POA: Diagnosis not present

## 2017-03-05 DIAGNOSIS — H25043 Posterior subcapsular polar age-related cataract, bilateral: Secondary | ICD-10-CM | POA: Diagnosis not present

## 2017-03-05 DIAGNOSIS — H5203 Hypermetropia, bilateral: Secondary | ICD-10-CM | POA: Diagnosis not present

## 2017-03-05 DIAGNOSIS — H2513 Age-related nuclear cataract, bilateral: Secondary | ICD-10-CM | POA: Diagnosis not present

## 2017-03-30 NOTE — Pre-Procedure Instructions (Signed)
Victor Fuller  03/30/2017      [facility] - [place], [place] - [address] AT [facility] [address] [place] [address] Phone: [phone] Fax: [phone]    Your procedure is scheduled on Thursday, April 02, 2017  Report to [facility] Admitting Entrance "A" at 5:30 A.M.   Call this number if you have problems the morning of surgery:  [phone]   Remember:  Do not eat food or drink liquids after midnight.  Take these medicines the morning of surgery with A SIP OF WATER: Pantoprazole (PROTONIX) and Allopurinol (ZYLOPRIM). If needed TraMADol (ULTRAM) for pain and Colchicine-probenecid for gout flare.  As of today, stop taking all Aspirins, Vitamins, Fish oils, and Herbal medications. Also stop all NSAIDS i.e. Advil, Motrin, Aleve, Anaprox, Naproxen, BC, Etodolac (LODINE),  and Goody Powders.   Do not wear jewelry.  Do not wear lotions, powders, colognes, or deodorant.  Do not shave 48 hours prior to surgery.  Men may shave face and neck.  Do not bring valuables to the hospital.  [facility] is not responsible for any belongings or valuables.  Contacts, dentures or bridgework may not be worn into surgery.  Leave your suitcase in the car.  After surgery it may be brought to your room.  For patients admitted to the hospital, discharge time will be determined by your treatment team.  Patients discharged the day of surgery will not be allowed to drive home.   Special instructions:  [facility]- Preparing For Surgery  Before surgery, you can play an important role. Because skin is not sterile, your skin needs to be as free of germs as possible. You can reduce the number of germs on your skin by washing with CHG (chlorahexidine gluconate) Soap before surgery.  CHG is an antiseptic cleaner which kills germs and bonds with the skin to continue killing germs even after washing.  Please do not use if you  have an allergy to CHG or antibacterial soaps. If your skin becomes reddened/irritated stop using the CHG.  Do not shave (including legs and underarms) for at least 48 hours prior to first CHG shower. It is OK to shave your face.  Please follow these instructions carefully.   1. Shower the NIGHT BEFORE SURGERY and the MORNING OF SURGERY with CHG.   2. If you chose to wash your hair, wash your hair first as usual with your normal shampoo.  3. After you shampoo, rinse your hair and body thoroughly to remove the shampoo.  4. Use CHG as you would any other liquid soap. You can apply CHG directly to the skin and wash gently with a scrungie or a clean washcloth.   5. Apply the CHG Soap to your body ONLY FROM THE NECK DOWN.  Do not use on open wounds or open sores. Avoid contact with your eyes, ears, mouth and genitals (private parts). Wash Face and genitals (private parts)  with your normal soap.  6. Wash thoroughly, paying special attention to the area where your surgery will be performed.  7. Thoroughly rinse your body with warm water from the neck down.  8. DO NOT shower/wash with your normal soap after using and rinsing off the CHG Soap.  9. Pat yourself dry with a CLEAN TOWEL.  10. Wear CLEAN PAJAMAS to bed the night before surgery, wear comfortable clothes the morning of surgery  11. Place CLEAN SHEETS on  your bed the night of your first shower and DO NOT SLEEP WITH PETS.  Day of Surgery: Do not apply any deodorants/lotions. Please wear clean clothes to the hospital/surgery center.    Please read over the following fact sheets that you were given. Pain Booklet, Coughing and Deep Breathing, MRSA Information and Surgical Site Infection Prevention

## 2017-03-31 ENCOUNTER — Encounter (HOSPITAL_COMMUNITY): Payer: Self-pay

## 2017-03-31 ENCOUNTER — Encounter (HOSPITAL_COMMUNITY)
Admission: RE | Admit: 2017-03-31 | Discharge: 2017-03-31 | Disposition: A | Discharge status: Home / Self Care | Payer: PPO | Source: Ambulatory Visit | Attending: Orthopedic Surgery | Admitting: Orthopedic Surgery

## 2017-03-31 DIAGNOSIS — I10 Essential (primary) hypertension: Secondary | ICD-10-CM | POA: Diagnosis present

## 2017-03-31 DIAGNOSIS — Z96661 Presence of right artificial ankle joint: Secondary | ICD-10-CM | POA: Diagnosis not present

## 2017-03-31 DIAGNOSIS — M12571 Traumatic arthropathy, right ankle and foot: Secondary | ICD-10-CM | POA: Diagnosis present

## 2017-03-31 DIAGNOSIS — Z8701 Personal history of pneumonia (recurrent): Secondary | ICD-10-CM | POA: Diagnosis not present

## 2017-03-31 DIAGNOSIS — Z87891 Personal history of nicotine dependence: Secondary | ICD-10-CM | POA: Diagnosis not present

## 2017-03-31 DIAGNOSIS — M109 Gout, unspecified: Secondary | ICD-10-CM | POA: Diagnosis present

## 2017-03-31 DIAGNOSIS — G8929 Other chronic pain: Secondary | ICD-10-CM | POA: Diagnosis present

## 2017-03-31 DIAGNOSIS — M545 Low back pain: Secondary | ICD-10-CM | POA: Diagnosis present

## 2017-03-31 DIAGNOSIS — Z471 Aftercare following joint replacement surgery: Secondary | ICD-10-CM | POA: Diagnosis not present

## 2017-03-31 DIAGNOSIS — M1711 Unilateral primary osteoarthritis, right knee: Secondary | ICD-10-CM | POA: Diagnosis not present

## 2017-03-31 DIAGNOSIS — M19071 Primary osteoarthritis, right ankle and foot: Secondary | ICD-10-CM | POA: Diagnosis not present

## 2017-03-31 DIAGNOSIS — I959 Hypotension, unspecified: Secondary | ICD-10-CM | POA: Diagnosis not present

## 2017-03-31 DIAGNOSIS — J449 Chronic obstructive pulmonary disease, unspecified: Secondary | ICD-10-CM | POA: Diagnosis present

## 2017-03-31 DIAGNOSIS — K219 Gastro-esophageal reflux disease without esophagitis: Secondary | ICD-10-CM | POA: Diagnosis present

## 2017-03-31 DIAGNOSIS — G8918 Other acute postprocedural pain: Secondary | ICD-10-CM | POA: Diagnosis not present

## 2017-03-31 LAB — BASIC METABOLIC PANEL
ANION GAP: 7 (ref 5–15)
BUN: 23 mg/dL — ABNORMAL HIGH (ref 6–20)
CALCIUM: 9.5 mg/dL (ref 8.9–10.3)
CO2: 26 mmol/L (ref 22–32)
CREATININE: 1.4 mg/dL — AB (ref 0.61–1.24)
Chloride: 102 mmol/L (ref 101–111)
GFR calc Af Amer: 57 mL/min — ABNORMAL LOW (ref >60)
GFR, EST NON AFRICAN AMERICAN: 49 mL/min — AB (ref >60)
GLUCOSE: 97 mg/dL (ref 65–99)
Potassium: 4.3 mmol/L (ref 3.5–5.1)
Sodium: 135 mmol/L (ref 135–145)

## 2017-03-31 LAB — CBC
HCT: 41.4 % (ref 39.0–52.0)
HEMOGLOBIN: 14.1 g/dL (ref 13.0–17.0)
MCH: 31.8 pg (ref 26.0–34.0)
MCHC: 34.1 g/dL (ref 30.0–36.0)
MCV: 93.5 fL (ref 78.0–100.0)
PLATELETS: 273 10*3/uL (ref 150–400)
RBC: 4.43 MIL/uL (ref 4.22–5.81)
RDW: 12.8 % (ref 11.5–15.5)
WBC: 6.9 10*3/uL (ref 4.0–10.5)

## 2017-03-31 LAB — SURGICAL PCR SCREEN
MRSA, PCR: NEGATIVE
STAPHYLOCOCCUS AUREUS: POSITIVE — AB

## 2017-03-31 NOTE — Progress Notes (Signed)
PCP - Dr. Lona Kettle  Cardiologist - Denies  Chest x-ray - Denies  EKG - 03/31/17  Stress Test - Denies  ECHO - Denies  Cardiac Cath - Denies  Sleep Study - No CPAP - None   Pt denies having chest pain, sob, or fever at this time. All instructions explained to the pt, with a verbal understanding of the material. Pt agrees to go over the instructions while at home for a better understanding. The opportunity to ask questions was provided.

## 2017-04-01 ENCOUNTER — Encounter (HOSPITAL_COMMUNITY): Payer: Self-pay | Admitting: Anesthesiology

## 2017-04-01 NOTE — Anesthesia Preprocedure Evaluation (Addendum)
Anesthesia Evaluation  Patient identified by MRN, date of birth, ID band Patient awake    Reviewed: Allergy & Precautions, NPO status , Patient's Chart, lab work & pertinent test results  Airway Mallampati: II  TM Distance: >3 FB Neck ROM: Full    Dental no notable dental hx. (+) Upper Dentures, Lower Dentures   Pulmonary COPD, former smoker,    Pulmonary exam normal breath sounds clear to auscultation       Cardiovascular hypertension, Pt. on medications Normal cardiovascular exam Rhythm:Regular Rate:Normal     Neuro/Psych negative neurological ROS  negative psych ROS   GI/Hepatic Neg liver ROS, GERD  Medicated and Controlled,  Endo/Other  negative endocrine ROS  Renal/GU Renal InsufficiencyRenal disease  negative genitourinary   Musculoskeletal  (+) Arthritis , Arthritis right ankle    Abdominal   Peds  Hematology negative hematology ROS (+)   Anesthesia Other Findings   Reproductive/Obstetrics                            Anesthesia Physical Anesthesia Plan  ASA: II  Anesthesia Plan: General   Post-op Pain Management:  Regional for Post-op pain   Induction: Intravenous  PONV Risk Score and Plan:   Airway Management Planned: LMA and Oral ETT  Additional Equipment:   Intra-op Plan:   Post-operative Plan: Extubation in OR  Informed Consent: I have reviewed the patients History and Physical, chart, labs and discussed the procedure including the risks, benefits and alternatives for the proposed anesthesia with the patient or authorized representative who has indicated his/her understanding and acceptance.   Dental advisory given  Plan Discussed with: CRNA, Anesthesiologist and Surgeon  Anesthesia Plan Comments:        Anesthesia Quick Evaluation

## 2017-04-02 ENCOUNTER — Inpatient Hospital Stay (HOSPITAL_COMMUNITY)
Admission: RE | Admit: 2017-04-02 | Discharge: 2017-04-03 | DRG: 469 | Disposition: A | Discharge status: Home / Self Care | Payer: PPO | Source: Ambulatory Visit | Attending: Orthopedic Surgery | Admitting: Orthopedic Surgery

## 2017-04-02 ENCOUNTER — Inpatient Hospital Stay (HOSPITAL_COMMUNITY): Payer: PPO | Admitting: Certified Registered Nurse Anesthetist

## 2017-04-02 ENCOUNTER — Inpatient Hospital Stay (HOSPITAL_COMMUNITY): Payer: PPO

## 2017-04-02 ENCOUNTER — Encounter (HOSPITAL_COMMUNITY)
Admission: RE | Disposition: A | Discharge status: Home / Self Care | Payer: Self-pay | Source: Ambulatory Visit | Attending: Orthopedic Surgery

## 2017-04-02 ENCOUNTER — Encounter (HOSPITAL_COMMUNITY): Payer: Self-pay | Admitting: General Practice

## 2017-04-02 DIAGNOSIS — Z8701 Personal history of pneumonia (recurrent): Secondary | ICD-10-CM | POA: Diagnosis not present

## 2017-04-02 DIAGNOSIS — K219 Gastro-esophageal reflux disease without esophagitis: Secondary | ICD-10-CM | POA: Diagnosis present

## 2017-04-02 DIAGNOSIS — Z471 Aftercare following joint replacement surgery: Secondary | ICD-10-CM | POA: Diagnosis not present

## 2017-04-02 DIAGNOSIS — Z96661 Presence of right artificial ankle joint: Secondary | ICD-10-CM

## 2017-04-02 DIAGNOSIS — M12571 Traumatic arthropathy, right ankle and foot: Principal | ICD-10-CM | POA: Diagnosis present

## 2017-04-02 DIAGNOSIS — M545 Low back pain: Secondary | ICD-10-CM | POA: Diagnosis present

## 2017-04-02 DIAGNOSIS — G8929 Other chronic pain: Secondary | ICD-10-CM | POA: Diagnosis present

## 2017-04-02 DIAGNOSIS — M109 Gout, unspecified: Secondary | ICD-10-CM | POA: Diagnosis present

## 2017-04-02 DIAGNOSIS — J449 Chronic obstructive pulmonary disease, unspecified: Secondary | ICD-10-CM | POA: Diagnosis present

## 2017-04-02 DIAGNOSIS — I10 Essential (primary) hypertension: Secondary | ICD-10-CM | POA: Diagnosis present

## 2017-04-02 DIAGNOSIS — Z87891 Personal history of nicotine dependence: Secondary | ICD-10-CM | POA: Diagnosis not present

## 2017-04-02 DIAGNOSIS — I959 Hypotension, unspecified: Secondary | ICD-10-CM | POA: Diagnosis not present

## 2017-04-02 DIAGNOSIS — Z419 Encounter for procedure for purposes other than remedying health state, unspecified: Secondary | ICD-10-CM

## 2017-04-02 HISTORY — DX: Low back pain, unspecified: M54.50

## 2017-04-02 HISTORY — DX: Personal history of other diseases of the musculoskeletal system and connective tissue: Z87.39

## 2017-04-02 HISTORY — DX: Low back pain: M54.5

## 2017-04-02 HISTORY — PX: TOTAL ANKLE ARTHROPLASTY: SHX811

## 2017-04-02 HISTORY — DX: Other chronic pain: G89.29

## 2017-04-02 HISTORY — DX: Pneumonia, unspecified organism: J18.9

## 2017-04-02 SURGERY — ARTHROPLASTY, ANKLE, TOTAL
Anesthesia: General | Site: Ankle | Laterality: Right

## 2017-04-02 MED ORDER — EPHEDRINE SULFATE 50 MG/ML IJ SOLN
INTRAMUSCULAR | Status: DC | PRN
  Administered 2017-04-02: 5 mg via INTRAVENOUS

## 2017-04-02 MED ORDER — ONDANSETRON HCL 4 MG/2ML IJ SOLN
INTRAMUSCULAR | Status: DC | PRN
  Administered 2017-04-02: 4 mg via INTRAVENOUS

## 2017-04-02 MED ORDER — ALLOPURINOL 300 MG PO TABS: 300.0000 mg | ORAL_TABLET | ORAL | Status: DC

## 2017-04-02 MED ORDER — PROMETHAZINE HCL 25 MG/ML IJ SOLN: 6.2500 mg | INTRAMUSCULAR | Status: DC | PRN

## 2017-04-02 MED ORDER — ETODOLAC 400 MG PO TABS
400.0000 mg | ORAL_TABLET | Freq: Two times a day (BID) | ORAL | Status: DC
  Administered 2017-04-02 – 2017-04-03 (×3): 400 mg via ORAL
  Filled 2017-04-02 (×3): qty 1

## 2017-04-02 MED ORDER — LIDOCAINE 2% (20 MG/ML) 5 ML SYRINGE
INTRAMUSCULAR | Status: DC | PRN
  Administered 2017-04-02: 100 mg via INTRAVENOUS

## 2017-04-02 MED ORDER — VANCOMYCIN HCL 500 MG IV SOLR
INTRAVENOUS | Status: AC
  Filled 2017-04-02: qty 500

## 2017-04-02 MED ORDER — ONDANSETRON HCL 4 MG/2ML IJ SOLN: 4.0000 mg | Freq: Four times a day (QID) | INTRAMUSCULAR | Status: DC | PRN

## 2017-04-02 MED ORDER — SODIUM CHLORIDE 0.9 % IV SOLN: INTRAVENOUS | Status: DC

## 2017-04-02 MED ORDER — METHOCARBAMOL 1000 MG/10ML IJ SOLN
500.0000 mg | Freq: Four times a day (QID) | INTRAVENOUS | Status: DC | PRN
  Filled 2017-04-02: qty 5

## 2017-04-02 MED ORDER — OXYCODONE HCL 5 MG PO TABS
5.0000 mg | ORAL_TABLET | ORAL | Status: DC | PRN
  Administered 2017-04-02 – 2017-04-03 (×2): 5 mg via ORAL
  Filled 2017-04-02: qty 2
  Filled 2017-04-02: qty 1

## 2017-04-02 MED ORDER — MEPERIDINE HCL 25 MG/ML IJ SOLN: 6.2500 mg | INTRAMUSCULAR | Status: DC | PRN

## 2017-04-02 MED ORDER — PANTOPRAZOLE SODIUM 40 MG PO TBEC
40.0000 mg | DELAYED_RELEASE_TABLET | Freq: Every day | ORAL | Status: DC
  Administered 2017-04-02 – 2017-04-03 (×2): 40 mg via ORAL
  Filled 2017-04-02 (×2): qty 1

## 2017-04-02 MED ORDER — ACETAMINOPHEN 650 MG RE SUPP: 650.0000 mg | Freq: Four times a day (QID) | RECTAL | Status: DC | PRN

## 2017-04-02 MED ORDER — CHLORHEXIDINE GLUCONATE 4 % EX LIQD: 60.0000 mL | Freq: Once | CUTANEOUS | Status: DC

## 2017-04-02 MED ORDER — METHOCARBAMOL 500 MG PO TABS
500.0000 mg | ORAL_TABLET | Freq: Four times a day (QID) | ORAL | Status: DC | PRN
  Administered 2017-04-02 – 2017-04-03 (×2): 500 mg via ORAL
  Filled 2017-04-02 (×2): qty 1

## 2017-04-02 MED ORDER — FENTANYL CITRATE (PF) 250 MCG/5ML IJ SOLN
INTRAMUSCULAR | Status: DC | PRN
  Administered 2017-04-02 (×3): 25 ug via INTRAVENOUS
  Administered 2017-04-02: 50 ug via INTRAVENOUS

## 2017-04-02 MED ORDER — GABAPENTIN 100 MG PO CAPS
100.0000 mg | ORAL_CAPSULE | Freq: Every day | ORAL | Status: DC
  Administered 2017-04-02: 100 mg via ORAL
  Filled 2017-04-02: qty 1

## 2017-04-02 MED ORDER — BUPIVACAINE-EPINEPHRINE (PF) 0.5% -1:200000 IJ SOLN
INTRAMUSCULAR | Status: DC | PRN
  Administered 2017-04-02: 25 mL via PERINEURAL

## 2017-04-02 MED ORDER — HYDROMORPHONE HCL 1 MG/ML IJ SOLN
0.2500 mg | INTRAMUSCULAR | Status: DC | PRN
Start: 2017-04-02 — End: 2017-04-02

## 2017-04-02 MED ORDER — MIDAZOLAM HCL 2 MG/2ML IJ SOLN
INTRAMUSCULAR | Status: DC | PRN
  Administered 2017-04-02 (×2): 1 mg via INTRAVENOUS

## 2017-04-02 MED ORDER — VANCOMYCIN HCL 1000 MG IV SOLR
INTRAVENOUS | Status: AC
  Filled 2017-04-02: qty 1000

## 2017-04-02 MED ORDER — HYDROCHLOROTHIAZIDE 12.5 MG PO CAPS
12.5000 mg | ORAL_CAPSULE | Freq: Every day | ORAL | Status: DC
  Administered 2017-04-02: 12.5 mg via ORAL
  Filled 2017-04-02: qty 1

## 2017-04-02 MED ORDER — ACETAMINOPHEN 325 MG PO TABS: 650.0000 mg | ORAL_TABLET | Freq: Four times a day (QID) | ORAL | Status: DC | PRN

## 2017-04-02 MED ORDER — PROPOFOL 10 MG/ML IV BOLUS
INTRAVENOUS | Status: AC
  Filled 2017-04-02: qty 20

## 2017-04-02 MED ORDER — ENOXAPARIN SODIUM 40 MG/0.4ML ~~LOC~~ SOLN
40.0000 mg | SUBCUTANEOUS | Status: DC
  Administered 2017-04-03: 40 mg via SUBCUTANEOUS
  Filled 2017-04-02: qty 0.4

## 2017-04-02 MED ORDER — LISINOPRIL-HYDROCHLOROTHIAZIDE 20-12.5 MG PO TABS: 1.0000 | ORAL_TABLET | Freq: Every morning | ORAL | Status: DC

## 2017-04-02 MED ORDER — LISINOPRIL 20 MG PO TABS
20.0000 mg | ORAL_TABLET | Freq: Every day | ORAL | Status: DC
  Administered 2017-04-02: 20 mg via ORAL
  Filled 2017-04-02: qty 1

## 2017-04-02 MED ORDER — MORPHINE SULFATE (PF) 4 MG/ML IV SOLN: 2.0000 mg | INTRAVENOUS | Status: DC | PRN

## 2017-04-02 MED ORDER — SENNA 8.6 MG PO TABS
1.0000 | ORAL_TABLET | Freq: Two times a day (BID) | ORAL | Status: DC
  Filled 2017-04-02 (×2): qty 1

## 2017-04-02 MED ORDER — ONDANSETRON HCL 4 MG PO TABS: 4.0000 mg | ORAL_TABLET | Freq: Four times a day (QID) | ORAL | Status: DC | PRN

## 2017-04-02 MED ORDER — LACTATED RINGERS IV SOLN
INTRAVENOUS | Status: DC | PRN
Start: 2017-04-02 — End: 2017-04-02
  Administered 2017-04-02 (×2): via INTRAVENOUS

## 2017-04-02 MED ORDER — CEFAZOLIN SODIUM-DEXTROSE 2-4 GM/100ML-% IV SOLN
2.0000 g | INTRAVENOUS | Status: AC
  Administered 2017-04-02: 2 g via INTRAVENOUS
  Filled 2017-04-02: qty 100

## 2017-04-02 MED ORDER — MIDAZOLAM HCL 2 MG/2ML IJ SOLN
INTRAMUSCULAR | Status: AC
  Filled 2017-04-02: qty 2

## 2017-04-02 MED ORDER — PROPOFOL 10 MG/ML IV BOLUS
INTRAVENOUS | Status: DC | PRN
  Administered 2017-04-02: 130 mg via INTRAVENOUS

## 2017-04-02 MED ORDER — 0.9 % SODIUM CHLORIDE (POUR BTL) OPTIME
TOPICAL | Status: DC | PRN
  Administered 2017-04-02: 1000 mL

## 2017-04-02 MED ORDER — DOCUSATE SODIUM 100 MG PO CAPS
100.0000 mg | ORAL_CAPSULE | Freq: Two times a day (BID) | ORAL | Status: DC
  Filled 2017-04-02 (×2): qty 1

## 2017-04-02 MED ORDER — DEXAMETHASONE SODIUM PHOSPHATE 10 MG/ML IJ SOLN
INTRAMUSCULAR | Status: DC | PRN
  Administered 2017-04-02: 10 mg via INTRAVENOUS

## 2017-04-02 MED ORDER — ROPIVACAINE HCL 5 MG/ML IJ SOLN
INTRAMUSCULAR | Status: DC | PRN
  Administered 2017-04-02: 20 mL via PERINEURAL

## 2017-04-02 MED ORDER — VANCOMYCIN HCL 500 MG IV SOLR
INTRAVENOUS | Status: DC | PRN
  Administered 2017-04-02: 500 mg

## 2017-04-02 MED ORDER — PHENYLEPHRINE HCL 10 MG/ML IJ SOLN
INTRAVENOUS | Status: DC | PRN
  Administered 2017-04-02: 25 ug/min via INTRAVENOUS

## 2017-04-02 MED ORDER — DIPHENHYDRAMINE HCL 12.5 MG/5ML PO ELIX: 12.5000 mg | ORAL_SOLUTION | ORAL | Status: DC | PRN

## 2017-04-02 MED ORDER — FENTANYL CITRATE (PF) 250 MCG/5ML IJ SOLN
INTRAMUSCULAR | Status: AC
  Filled 2017-04-02: qty 5

## 2017-04-02 SURGICAL SUPPLY — 53 items
BANDAGE ESMARK 6X9 LF (GAUZE/BANDAGES/DRESSINGS) ×1 IMPLANT
BLADE OSC (BLADE) ×3 IMPLANT
BLADE RECIP (BLADE) ×3 IMPLANT
BLADE RECIPRO TAPERED (BLADE) ×6 IMPLANT
BLADE SURG 15 STRL LF DISP TIS (BLADE) ×2 IMPLANT
BLADE SURG 15 STRL SS (BLADE) ×4
BNDG ESMARK 6X9 LF (GAUZE/BANDAGES/DRESSINGS) ×3
CHLORAPREP W/TINT 26ML (MISCELLANEOUS) ×3 IMPLANT
CORE SLIDING POLY ANKLE 9MM (Orthopedic Implant) ×3 IMPLANT
COVER SURGICAL LIGHT HANDLE (MISCELLANEOUS) ×3 IMPLANT
CUFF TOURNIQUET SINGLE 34IN LL (TOURNIQUET CUFF) ×3 IMPLANT
DISPOSABLES PACK SBI (PACKS) ×3 IMPLANT
DRAPE C-ARM 42X72 X-RAY (DRAPES) ×3 IMPLANT
DRAPE C-ARMOR (DRAPES) ×3 IMPLANT
DRAPE HALF SHEET 40X57 (DRAPES) IMPLANT
DRAPE U-SHAPE 47X51 STRL (DRAPES) ×3 IMPLANT
DRSG MEPILEX BORDER 4X4 (GAUZE/BANDAGES/DRESSINGS) ×3 IMPLANT
DRSG MEPITEL 4X7.2 (GAUZE/BANDAGES/DRESSINGS) ×3 IMPLANT
DRSG PAD ABDOMINAL 8X10 ST (GAUZE/BANDAGES/DRESSINGS) ×6 IMPLANT
ELECT REM PT RETURN 9FT ADLT (ELECTROSURGICAL) ×3
ELECTRODE REM PT RTRN 9FT ADLT (ELECTROSURGICAL) ×1 IMPLANT
GAUZE SPONGE 4X4 12PLY STRL (GAUZE/BANDAGES/DRESSINGS) ×6 IMPLANT
GLOVE BIO SURGEON STRL SZ8 (GLOVE) ×3 IMPLANT
GLOVE BIOGEL PI IND STRL 8 (GLOVE) ×2 IMPLANT
GLOVE BIOGEL PI INDICATOR 8 (GLOVE) ×4
GLOVE ECLIPSE 8.0 STRL XLNG CF (GLOVE) ×3 IMPLANT
GOWN STRL REUS W/ TWL LRG LVL3 (GOWN DISPOSABLE) ×1 IMPLANT
GOWN STRL REUS W/ TWL XL LVL3 (GOWN DISPOSABLE) ×2 IMPLANT
GOWN STRL REUS W/TWL LRG LVL3 (GOWN DISPOSABLE) ×5 IMPLANT
GOWN STRL REUS W/TWL XL LVL3 (GOWN DISPOSABLE) ×4
GUIDEWIRE GAMMA (WIRE) ×3 IMPLANT
IMPLANT TALAR STAR SZ S RT (Orthopedic Implant) ×3 IMPLANT
IMPLANT TIBIAL STAR SZ M (Orthopedic Implant) ×3 IMPLANT
KIT BASIN OR (CUSTOM PROCEDURE TRAY) ×3 IMPLANT
KIT ROOM TURNOVER OR (KITS) ×3 IMPLANT
NS IRRIG 1000ML POUR BTL (IV SOLUTION) ×3 IMPLANT
PACK ORTHO EXTREMITY (CUSTOM PROCEDURE TRAY) ×3 IMPLANT
PAD ARMBOARD 7.5X6 YLW CONV (MISCELLANEOUS) ×6 IMPLANT
PAD CAST 4YDX4 CTTN HI CHSV (CAST SUPPLIES) ×3 IMPLANT
PADDING CAST COTTON 4X4 STRL (CAST SUPPLIES) ×6
PADDING CAST COTTON 6X4 STRL (CAST SUPPLIES) ×3 IMPLANT
STOCKINETTE TUBULAR SYNTH 6IN (GAUZE/BANDAGES/DRESSINGS) ×3 IMPLANT
SUCTION FRAZIER TIP 10 FR DISP (SUCTIONS) IMPLANT
SURGILUBE 2OZ TUBE FLIPTOP (MISCELLANEOUS) ×3 IMPLANT
SUT ETHILON 3 0 PS 1 (SUTURE) ×6 IMPLANT
SUT MNCRL AB 3-0 PS2 18 (SUTURE) ×6 IMPLANT
SUT VIC AB 0 CT1 27 (SUTURE) ×4
SUT VIC AB 0 CT1 27XBRD ANBCTR (SUTURE) ×2 IMPLANT
TOWEL OR 17X24 6PK STRL BLUE (TOWEL DISPOSABLE) ×3 IMPLANT
TOWEL OR 17X26 10 PK STRL BLUE (TOWEL DISPOSABLE) ×3 IMPLANT
TUBE CONNECTING 12'X1/4 (SUCTIONS) ×1
TUBE CONNECTING 12X1/4 (SUCTIONS) ×2 IMPLANT
YANKAUER SUCT BULB TIP NO VENT (SUCTIONS) ×3 IMPLANT

## 2017-04-02 NOTE — Discharge Instructions (Signed)
Toni ArthursJohn Hewitt, MD [facility]  Please read the following information regarding your care after surgery.  Medications  You only need a prescription for the narcotic pain medicine (ex. oxycodone, Percocet, Norco).  All of the other medicines listed below are available over the counter. X Etorolac that you have at home for the first 3 days after surgery. X acetominophen (Tylenol) 650 mg every 4-6 hours as you need for minor to moderate pain X oxycodone as prescribed for severe pain  Narcotic pain medicine (ex. oxycodone, Percocet, Vicodin) will cause constipation.  To prevent this problem, take the following medicines while you are taking any pain medicine. X docusate sodium (Colace) 100 mg twice a day X senna (Senokot) 2 tablets twice a day  X To help prevent blood clots, take a baby aspirin (81 mg) twice a day for two weeks after surgery.  You should also get up every hour while you are awake to move around.    Weight Bearing X Do not bear any weight on the operated leg or foot.  Cast / Splint / Dressing X Keep your splint, cast or dressing clean and dry.  Dont put anything (coat hanger, pencil, etc) down inside of it.  If it gets damp, use a hair dryer on the cool setting to dry it.  If it gets soaked, call the office to schedule an appointment for a cast change.  After your dressing, cast or splint is removed; you may shower, but do not soak or scrub the wound.  Allow the water to run over it, and then gently pat it dry.  Swelling It is normal for you to have swelling where you had surgery.  To reduce swelling and pain, keep your toes above your nose for at least 3 days after surgery.  It may be necessary to keep your foot or leg elevated for several weeks.  If it hurts, it should be elevated.  Follow Up Call my office at [phone] when you are discharged from the hospital or surgery center to schedule an appointment to be seen two weeks after surgery.  Call my office at  [phone] if you develop a fever >101.5 F, nausea, vomiting, bleeding from the surgical site or severe pain.

## 2017-04-02 NOTE — H&P (Signed)
Victor Fuller is an 71 y.o. male.   Chief Complaint:  Right ankle pain HPI:  71 y/o male with right ankle arthritis presents today for right total ankle replacement.  He has failed non op treatment including activity modification, bracing, oral pain meds and therapy.  Past Medical History:  Diagnosis Date  . Arthritis    hands and right ankle  . COPD (chronic obstructive pulmonary disease) ([place])   . GERD (gastroesophageal reflux disease)   . Hypertension     Past Surgical History:  Procedure Laterality Date  . ACHILLES TENDON SURGERY     right  . COLONOSCOPY WITH PROPOFOL N/A 06/26/2014   Procedure: COLONOSCOPY WITH PROPOFOL;  Surgeon: Garlan Fair, MD;  Location: [facility]COPY;  Service: Endoscopy;  Laterality: N/A;    History reviewed. No pertinent family history. Social History:  reports that he quit smoking about 17 years ago. He quit after 15.00 years of use. He has never used smokeless tobacco. He reports that he does not drink alcohol or use drugs.  Allergies: No Known Allergies  Medications Prior to Admission  Medication Sig Dispense Refill  . allopurinol (ZYLOPRIM) 300 MG tablet Take 300 mg by mouth every 3 (three) days.   6  . etodolac (LODINE) 400 MG tablet Take 400 mg by mouth 2 (two) times daily.   3  . gabapentin (NEURONTIN) 100 MG capsule Take 100 mg by mouth at bedtime.    Marland Kitchen ibuprofen (ADVIL,MOTRIN) 200 MG tablet Take 200-400 mg by mouth every 6 (six) hours as needed for headache or moderate pain.     Marland Kitchen lisinopril-hydrochlorothiazide (PRINZIDE,ZESTORETIC) 20-12.5 MG per tablet Take 1 tablet by mouth every morning.   9  . pantoprazole (PROTONIX) 40 MG tablet Take 40 mg by mouth daily.   6  . traMADol (ULTRAM) 50 MG tablet Take 50 mg by mouth daily as needed for moderate pain.   5  . colchicine-probenecid 0.5-500 MG per tablet Take 1 tablet by mouth twice daily as needed for gout pain  1    Results for orders placed or performed during the hospital encounter of  03/31/17 (from the past 48 hour(s))  Basic metabolic panel     Status: Abnormal   Collection Time: 03/31/17  9:30 AM  Result Value Ref Range   Sodium 135 135 - 145 mmol/L   Potassium 4.3 3.5 - 5.1 mmol/L   Chloride 102 101 - 111 mmol/L   CO2 26 22 - 32 mmol/L   Glucose, Bld 97 65 - 99 mg/dL   BUN 23 (H) 6 - 20 mg/dL   Creatinine, Ser 1.40 (H) 0.61 - 1.24 mg/dL   Calcium 9.5 8.9 - 10.3 mg/dL   GFR calc non Af Amer 49 (L) >60 mL/min   GFR calc Af Amer 57 (L) >60 mL/min    Comment: (NOTE) The eGFR has been calculated using the CKD EPI equation. This calculation has not been validated in all clinical situations. eGFR's persistently <60 mL/min signify possible Chronic Kidney Disease.    Anion gap 7 5 - 15  CBC     Status: None   Collection Time: 03/31/17  9:30 AM  Result Value Ref Range   WBC 6.9 4.0 - 10.5 K/uL   RBC 4.43 4.22 - 5.81 MIL/uL   Hemoglobin 14.1 13.0 - 17.0 g/dL   HCT 41.4 39.0 - 52.0 %   MCV 93.5 78.0 - 100.0 fL   MCH 31.8 26.0 - 34.0 pg   MCHC 34.1 30.0 -  36.0 g/dL   RDW 12.8 11.5 - 15.5 %   Platelets 273 150 - 400 K/uL  Surgical pcr screen     Status: Abnormal   Collection Time: 03/31/17  9:37 AM  Result Value Ref Range   MRSA, PCR NEGATIVE NEGATIVE   Staphylococcus aureus POSITIVE (A) NEGATIVE    Comment: (NOTE) The Xpert SA Assay (FDA approved for NASAL specimens in patients 68 years of age and older), is one component of a comprehensive surveillance program. It is not intended to diagnose infection nor to guide or monitor treatment.    No results found.  ROS no recent f/c/n/v/ wt loss  Blood pressure (!) 158/89, pulse 79, temperature 97.8 F (36.6 C), temperature source Oral, resp. rate 20, SpO2 97 %. Physical Exam  wn wd male in nad.  A and O x 4.  Mood and affect normal.  EOMI.  resp unlabored.  R ankle with decreased ROM.  Skin healthy and intact.  No lymphadenopathy.  5/5 strength in PF and DF of the ankle and toes.  Sensability to LT intact at  the foot dorsally and plantarly.  Assessment/Plan R ankle arthritis - to OR for surgical treatment.  The risks and benefits of the alternative treatment options have been discussed in detail.  The patient wishes to proceed with surgery and specifically understands risks of bleeding, infection, nerve damage, blood clots, need for additional surgery, amputation and death.   Wylene Simmer, MD 04/11/2017, 7:16 AM

## 2017-04-02 NOTE — Transfer of Care (Signed)
Immediate Anesthesia Transfer of Care Note  Patient: Victor Fuller  Procedure(s) Performed: RIGHT TOTAL ANKLE ARTHOPLASTY (Right Ankle)  Patient Location: PACU  Anesthesia Type:General  Level of Consciousness: awake, alert  and oriented  Airway & Oxygen Therapy: Spontaneously breathing  Post-op Assessment: Report given to RN  Post vital signs: Reviewed and stable  Last Vitals:  Vitals:   04/02/17 0547  BP: (!) 158/89  Pulse: 79  Resp: 20  Temp: 36.6 C  SpO2: 97%    Last Pain:  Vitals:   04/02/17 0547  TempSrc: Oral         Complications: No apparent anesthesia complications

## 2017-04-02 NOTE — Anesthesia Procedure Notes (Signed)
Procedure Name: LMA Insertion Date/Time: 04/02/2017 7:33 AM Performed by: Alvera NovelPIKE, Lolah Coghlan H Pre-anesthesia Checklist: Patient identified, Emergency Drugs available, Suction available and Patient being monitored Patient Re-evaluated:Patient Re-evaluated prior to induction Oxygen Delivery Method: Circle System Utilized Preoxygenation: Pre-oxygenation with 100% oxygen Induction Type: IV induction Ventilation: Mask ventilation without difficulty LMA: LMA with gastric port inserted LMA Size: 4.0 Number of attempts: 1 Placement Confirmation: positive ETCO2 Tube secured with: Tape Dental Injury: Teeth and Oropharynx as per pre-operative assessment

## 2017-04-02 NOTE — Op Note (Signed)
04/02/2017  9:48 AM  PATIENT:  Victor Fuller  71 y.o. male  PRE-OPERATIVE DIAGNOSIS:  Right ankle post traumatic arthritis   POST-OPERATIVE DIAGNOSIS:  Right ankle post traumatic arthritis  Procedure(s): RIGHT TOTAL ANKLE ARTHOPLASTY.  Star - small talus, medium tibia, 9 mm poly  SURGEON:  Toni ArthursJohn Kimia Finan, MD  ASSISTANT: Alfredo MartinezJustin Ollis, PA-C  ANESTHESIA:   General, regional  EBL:  minimal   TOURNIQUET:   Total Tourniquet Time Documented: Thigh (Right) - 98 minutes Total: Thigh (Right) - 98 minutes  COMPLICATIONS:  None apparent  DISPOSITION:  Extubated, awake and stable to recovery.  INDICATION FOR PROCEDURE: The patient is a 71 year old male with a history of right ankle pain for several years.  He has a history of multiple ankle injuries.  He has failed nonoperative treatment to date including activity modification, oral anti-inflammatories, shoe wear modification, bracing and therapy.  He presents today for right total ankle replacement.  The risks and benefits of the alternative treatment options have been discussed in detail.  The patient wishes to proceed with surgery and specifically understands risks of bleeding, infection, nerve damage, blood clots, need for additional surgery, amputation and death.  PROCEDURE IN DETAIL:After pre operative consent was obtained, and the correct operative site was identified, the patient was brought to the operating room and placed supine on the OR table.  Anesthesia was administered.  Pre-operative antibiotics were administered.  A surgical timeout was taken.  The right lower extremity was then prepped and draped in standard sterile fashion with a tourniquet around the thigh.  The extremity was exsanguinated and the tourniquet was inflated to 250 mmHg.  A longitudinal incision was then made over the anterior ankle overlying the extensor hallucis longus tendon sheath.  Dissection was carried down through this obtains tissues and the extensor  retinaculum was released over the EHL tendon.  The interval between the EHL and tibialis anterior was then developed.  The neurovascular bundle was identified.  It was mobilized and retracted laterally.  It was protected throughout the case.  The anterior ankle joint capsule was incised and elevated medially and laterally.  A stab incision was made over the tibial tubercle.  A 3.2 mm guide pin was inserted in bicortical fashion in line with an osteotome in the medial gutter.  The external alignment guide was then applied and aligned in the AP and lateral planes using fluoroscopic guidance.  It was provisionally pinned distally.  Rotation was set in line with the medial gutter osteotome.  The depth of the cut and slope was set on lateral view and the cutting block was pinned.  Medial lateral translation of the cutting block was then confirmed on AP radiograph and the block was pinned.  The distal tibial cut was made and all cut bone removed.  The talar cutting guide was then inserted.  A lateral view confirmed appropriate alignment of the talar cut and the block was pinned.  The talus cut was made in the talus sized as a small.  The anterior posterior cutting guide was applied and the cuts made.  All waist bone was removed.  The medial lateral cutting guide was then applied.  The cuts were made and waist bone was removed.  The medial malleolus was carefully examined.  The deltoid was noted to be tight relative to the lateral ligaments.  It was released as a full-thickness soft tissue sleeve.  The window trial was placed over the talus and was noted to fit appropriately.  It was pinned and the keel hole drilled.  Guide was removed and the keel hole was punched.  The wound was irrigated copiously.  Vancomycin powder was sprinkled over the talus and the implant was positioned and impacted appropriately.  The distal tibia was sized as a medium.  A medium trial was inserted.  AP and lateral radiographs confirmed  appropriate size and position of the trial.  Trial was pinned into place and the barrel holes drilled and punched.  Trial was removed.  Wound was irrigated and vancomycin powder was speckled over the bone.  A size medium tibial component was inserted and impacted into position appropriately.  An 8 mm trial poly-was inserted.  Ligamentous balance was noted to be appropriate medially and laterally.  The trial poly-was removed and a 9 mm poly-implant was inserted The wound was irrigated copiously.  AP, mortise and lateral radio graphs confirmed appropriate position of the hardware.  The barrel holes were filled with bone graft.  The extensor retinaculum was repaired with simple and running suture of 0 Vicryl.  The simultaneous tissues were approximated with Monocryl.  The skin incision was closed with nylon.  Approximately the wound was closed with nylon.  Sterile dressings were applied followed by well-padded short leg cast.  Patient was then awakened from anesthesia and transported to the recovery room in stable condition.  The tourniquet had been released after application of the dressings at 98 minutes.  FOLLOW UP PLAN: Patient will be admitted for physical therapy.  He will be nonweightbearing on his right lower extremity.  Aspirin for DVT prophylaxis upon discharge.  Follow-up with me in the office in 3 weeks for suture removal and conversion to a cam boot.   Alfredo Martinez PA-C was present and scrubbed for the duration of the operative case. His assistance assistance was essential in positioning the patient, prepping and draping, gaining maintaining exposure, performing the operation, closing and dressing the wounds and applying the splint.

## 2017-04-02 NOTE — Evaluation (Signed)
Physical Therapy Evaluation Patient Details Name: Victor Fuller MRN: [id number] DOB: 12/16/1945 Today's Date: 04/02/2017   History of Present Illness  Pt is a 71 y.o. male now s/p elective R total ankle replacement on 04/02/17. Pertinent PMH includes HTN, GERD, COPD, arthritis.     Clinical Impression  Patient evaluated by Physical Therapy with no further acute PT needs identified. PTA, pt indep with mobility and lives with family who will be available for 24/7 assist if needed. Today, pt mod indep for transfers, amb, and stairs with bilateral axillary crutches. Pt with good technique on maintaining RLE NWB precautions. Educ on positioning, fall risk reduction, and importance of mobility. Encouraged to continue amb in hallway with crutches and assist from family and/or nursing for IV pole. All education has been completed and the patient has no further questions. Discussed potential for outpatient ortho PT in the future when NWB orders change. PT is signing off. Thank you for this referral.    Follow Up Recommendations No PT follow up    Equipment Recommendations  None recommended by PT (owns DME)    Recommendations for Other Services       Precautions / Restrictions Precautions Precautions: None Required Braces or Orthoses: Other Brace/Splint Other Brace/Splint: Hard cast on ankle Restrictions Weight Bearing Restrictions: Yes RLE Weight Bearing: Non weight bearing      Mobility  Bed Mobility Overal bed mobility: Independent                Transfers Overall transfer level: Modified independent Equipment used: None;Crutches             General transfer comment: Good technique with crutches  Ambulation/Gait Ambulation/Gait assistance: Modified independent (Device/Increase time) Ambulation Distance (Feet): 200 Feet Assistive device: Crutches   Gait velocity: Decreased   General Gait Details: Mod indep with bilateral axillary crutches. Good technique with  hop-through pattern in order to maintain RLE NWB precautions  Stairs Stairs: Yes Stairs assistance: Modified independent (Device/Increase time) Stair Management: One rail Left;With crutches;Step to pattern;Forwards Number of Stairs: 5 General stair comments: Ascended steps with LUE on rail and R-side crutch; descended with RUE on rail and L-side crutch. Educ on technique; progressing to mod indep. Only assist to maneuver IV pole  Wheelchair Mobility    Modified Rankin (Stroke Patients Only)       Balance Overall balance assessment: Needs assistance Sitting-balance support: No upper extremity supported;Feet unsupported Sitting balance-Leahy Scale: Good     Standing balance support: Bilateral upper extremity supported;No upper extremity supported;During functional activity Standing balance-Leahy Scale: Fair Standing balance comment: Able to static stand with no UE support                             Pertinent Vitals/Pain Pain Assessment: No/denies pain    Home Living Family/patient expects to be discharged to:: Private residence Living Arrangements: Spouse/significant other;Children Available Help at Discharge: Family Type of Home: House Home Access: Stairs to enter Entrance Stairs-Rails: Doctor, general practice of Steps: 4 Home Layout: One level Home Equipment: Environmental consultant - standard;Crutches      Prior Function Level of Independence: Independent               Hand Dominance        Extremity/Trunk Assessment   Upper Extremity Assessment Upper Extremity Assessment: Overall WFL for tasks assessed    Lower Extremity Assessment Lower Extremity Assessment: RLE deficits/detail RLE Deficits / Details: s/p total ankle replacement;  hip flex 4/5, knee flex/ext grossly 3/5; unable to wiggle toes secondary to post-op numbness       Communication   Communication: No difficulties  Cognition Arousal/Alertness: Awake/alert Behavior During Therapy:  WFL for tasks assessed/performed Overall Cognitive Status: Within Functional Limits for tasks assessed                                        General Comments General comments (skin integrity, edema, etc.): Daughters present during session    Exercises     Assessment/Plan    PT Assessment Patent does not need any further PT services  PT Problem List         PT Treatment Interventions      PT Goals (Current goals can be found in the Care Plan section)  Acute Rehab PT Goals Patient Stated Goal: Return home PT Goal Formulation: With patient Time For Goal Achievement: 04/16/17 Potential to Achieve Goals: Good    Frequency     Barriers to discharge        Co-evaluation               AM-PAC PT "6 Clicks" Daily Activity  Outcome Measure Difficulty turning over in bed (including adjusting bedclothes, sheets and blankets)?: None Difficulty moving from lying on back to sitting on the side of the bed? : None Difficulty sitting down on and standing up from a chair with arms (e.g., wheelchair, bedside commode, etc,.)?: None Help needed moving to and from a bed to chair (including a wheelchair)?: None Help needed walking in hospital room?: None Help needed climbing 3-5 steps with a railing? : None 6 Click Score: 24    End of Session Equipment Utilized During Treatment: Gait belt Activity Tolerance: Patient tolerated treatment well Patient left: in chair;with call bell/phone within reach;with family/visitor present Nurse Communication: Mobility status PT Visit Diagnosis: Other abnormalities of gait and mobility (R26.89)    Time: 1435-1510 PT Time Calculation (min) (ACUTE ONLY): 35 min   Charges:   PT Evaluation $PT Eval Low Complexity: 1 Low PT Treatments $Gait Training: 8-22 mins   PT G Codes:       Ina HomesJaclyn Nakeysha Pasqual, PT, DPT Acute Rehab Services  Pager: [phone]  Malachy ChamberJaclyn L Analeigh Aries 04/02/2017, 3:19 PM

## 2017-04-02 NOTE — Anesthesia Procedure Notes (Signed)
Anesthesia Regional Block: Adductor canal block   Pre-Anesthetic Checklist: ,, timeout performed, Correct Patient, Correct Site, Correct Laterality, Correct Procedure, Correct Position, site marked, Risks and benefits discussed,  Surgical consent,  Pre-op evaluation,  At surgeon's request and post-op pain management  Laterality: Right  Prep: chloraprep       Needles:  Injection technique: Single-shot  Needle Type: Echogenic Stimulator Needle     Needle Length: 9cm  Needle Gauge: 21   Needle insertion depth: 4 cm   Additional Needles:   Procedures:,,,, ultrasound used (permanent image in chart),,,,  Narrative:  Start time: 04/02/2017 7:12 AM End time: 04/02/2017 7:16 AM Injection made incrementally with aspirations every 5 mL.  Performed by: Personally  Anesthesiologist: Mal AmabileFOSTER, Otoniel Myhand  Additional Notes: Relevant anatomy ID'd using US. Incremental 2-505ml injection of LA with frequent aspiration. Patient tolerated procedure well.

## 2017-04-02 NOTE — Progress Notes (Signed)
Report given to maryann rn as caregiver 

## 2017-04-02 NOTE — Anesthesia Procedure Notes (Signed)
Anesthesia Regional Block: Popliteal block   Pre-Anesthetic Checklist: ,, timeout performed, Correct Patient, Correct Site, Correct Laterality, Correct Procedure, Correct Position, site marked, Risks and benefits discussed,  Surgical consent,  Pre-op evaluation,  At surgeon's request and post-op pain management  Laterality: Right  Prep: chloraprep       Needles:  Injection technique: Single-shot  Needle Type: Echogenic Stimulator Needle     Needle Length: 9cm  Needle Gauge: 21   Needle insertion depth: 4 cm   Additional Needles:   Procedures:,,,, ultrasound used (permanent image in chart),,,,  Narrative:  Start time: 04/02/2017 7:06 AM End time: 04/02/2017 7:11 AM Injection made incrementally with aspirations every 5 mL.  Performed by: Personally  Anesthesiologist: Mal AmabileFOSTER, Zell Doucette  Additional Notes: Timeout performed. Patient sedated. Relevant anatomy ID'd using US. Incremental 2-305ml injection of LA with frequent aspiration. Patient tolerated procedure well.

## 2017-04-02 NOTE — Anesthesia Postprocedure Evaluation (Signed)
Anesthesia Post Note  Patient: Victor Fuller  Procedure(s) Performed: RIGHT TOTAL ANKLE ARTHOPLASTY (Right Ankle)     Patient location during evaluation: PACU Anesthesia Type: General Level of consciousness: awake and alert and oriented Pain management: pain level controlled Vital Signs Assessment: post-procedure vital signs reviewed and stable Respiratory status: spontaneous breathing, nonlabored ventilation and respiratory function stable Cardiovascular status: blood pressure returned to baseline and stable Postop Assessment: no apparent nausea or vomiting Anesthetic complications: no    Last Vitals:  Vitals:   04/02/17 0945 04/02/17 1000  BP: (!) 144/85 (!) 145/82  Pulse: 92 88  Resp: 15   Temp: 36.7 C   SpO2: 98%     Last Pain:  Vitals:   04/02/17 1000  TempSrc:   PainSc: 0-No pain                 [name]

## 2017-04-03 LAB — CBC
HEMATOCRIT: 35.8 % — AB (ref 39.0–52.0)
Hemoglobin: 11.9 g/dL — ABNORMAL LOW (ref 13.0–17.0)
MCH: 31.8 pg (ref 26.0–34.0)
MCHC: 33.2 g/dL (ref 30.0–36.0)
MCV: 95.7 fL (ref 78.0–100.0)
PLATELETS: 251 10*3/uL (ref 150–400)
RBC: 3.74 MIL/uL — ABNORMAL LOW (ref 4.22–5.81)
RDW: 13.2 % (ref 11.5–15.5)
WBC: 11.4 10*3/uL — ABNORMAL HIGH (ref 4.0–10.5)

## 2017-04-03 IMAGING — CR DG ORBITS FOR FOREIGN BODY
2 series · 2 of 2 positions shown · non-contrast
Comparison: None.

CLINICAL DATA: Metal working/exposure; clearance prior to MRI

EXAM:
ORBITS FOR FOREIGN BODY - 2 VIEW

[w orbit pa (1 of 2)]
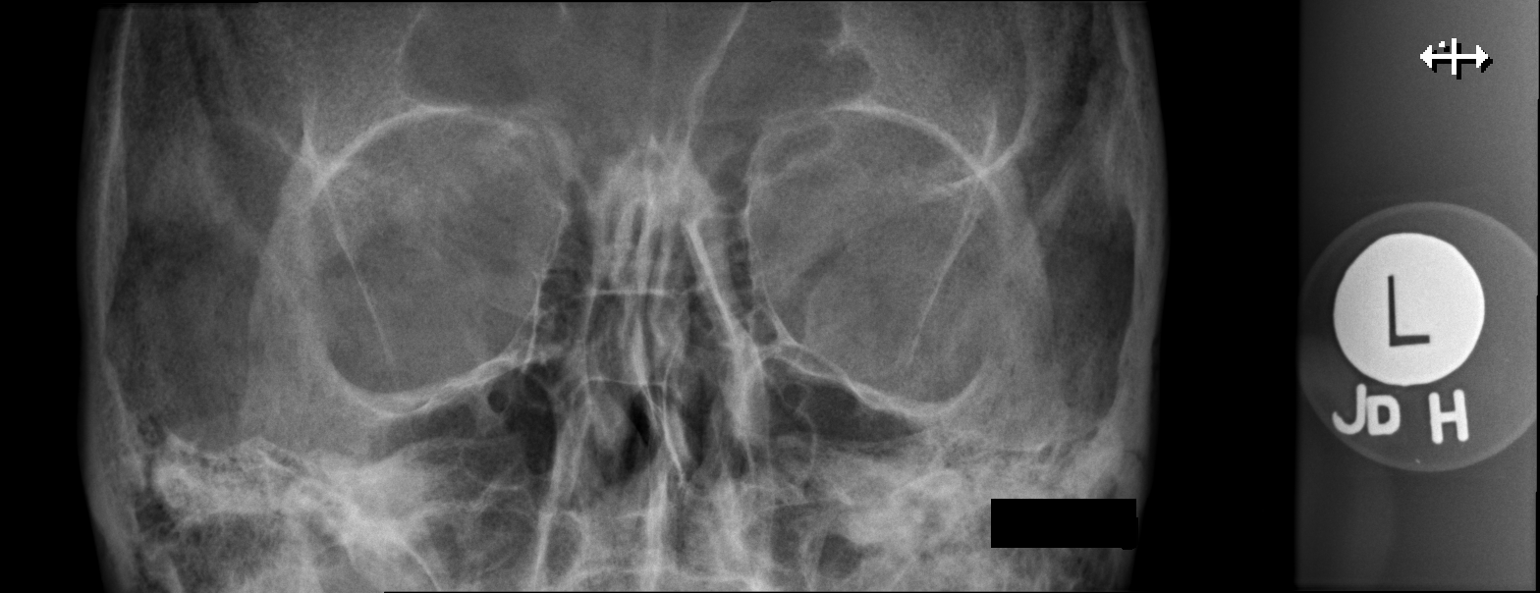

[w orbit pa (2 of 2)]
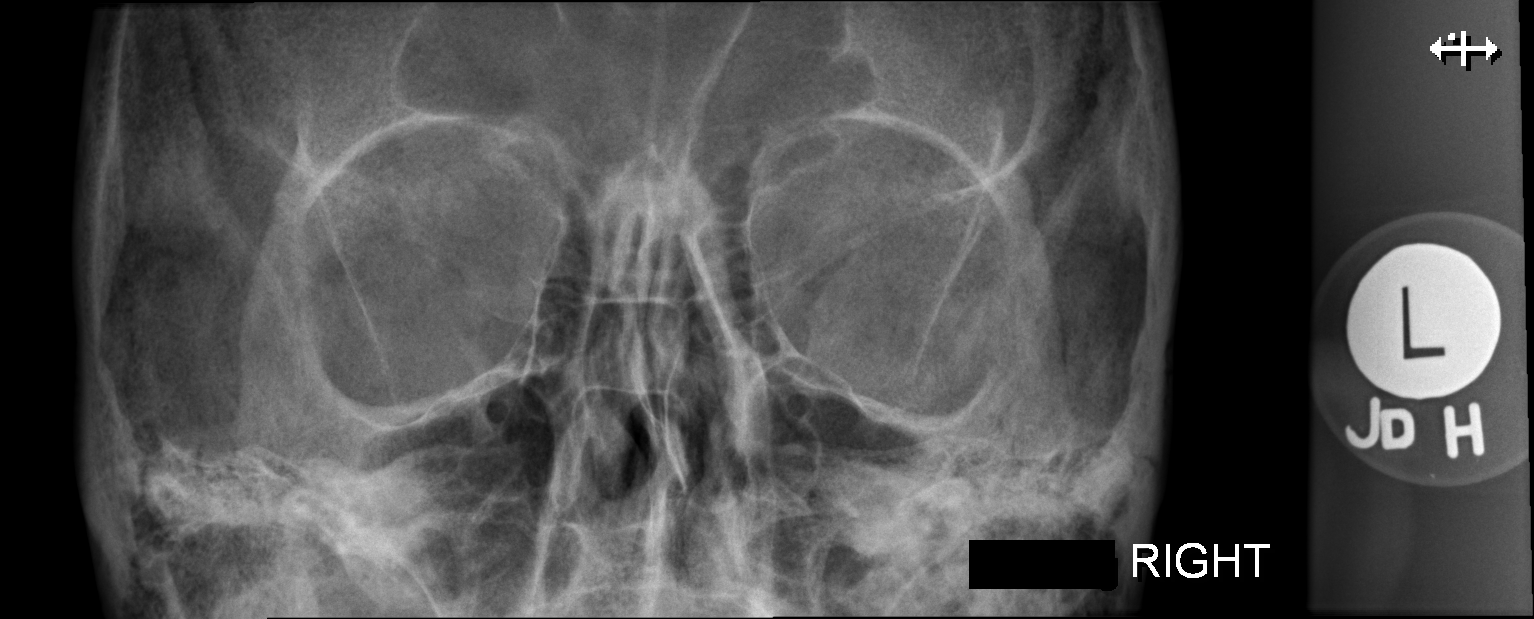

[2 of 2 positions shown; findings below may reference images not displayed]

FINDINGS: Water's views with eyes deviated toward the left and toward the
right were obtained. There is no intraorbital radiopaque foreign
body. Visualized paranasal sinuses clear. No fracture. Deviated
nasal septum noted.
IMPRESSION: No evidence of metallic foreign body within the orbits.

## 2017-04-03 MED ORDER — DOCUSATE SODIUM 100 MG PO CAPS: 100.0000 mg | ORAL_CAPSULE | Freq: Two times a day (BID) | ORAL | 0 refills | Status: AC

## 2017-04-03 MED ORDER — SODIUM CHLORIDE 0.9 % IV BOLUS (SEPSIS): 500.0000 mL | Freq: Once | INTRAVENOUS | Status: DC | PRN

## 2017-04-03 MED ORDER — ASPIRIN EC 81 MG PO TBEC: 81.0000 mg | DELAYED_RELEASE_TABLET | Freq: Two times a day (BID) | ORAL | 0 refills | Status: AC

## 2017-04-03 MED ORDER — SENNA 8.6 MG PO TABS: 2.0000 | ORAL_TABLET | Freq: Two times a day (BID) | ORAL | 0 refills | Status: AC

## 2017-04-03 NOTE — Progress Notes (Signed)
Removed IV, provided discharge education/instruction, all questions and concerns addressed, Pt not in distress, discharged home accompanied by daughter with Pt belongings.

## 2017-04-03 NOTE — Progress Notes (Signed)
Subjective: 1 Day Post-Op Procedure(s) (LRB): RIGHT TOTAL ANKLE ARTHOPLASTY (Right)  Patient reports pain as mild to moderate.  Patient resting comfortably in bed and reports that he would like to go home today.  He was hypotensive through the night, but asymptomatic.  Was given 500cc NS bolus during the night.  Denies fever, chills, N/V, SOB, CP, light headedness.  Tolerating POs well.  Admits to flatus  Objective:   VITALS:  Temp:  [97 F (36.1 C)-98.5 F (36.9 C)] 98.3 F (36.8 C) (10/19 0425) Pulse Rate:  [70-92] 72 (10/19 0425) Resp:  [14-20] 17 (10/19 0425) BP: (80-156)/(47-85) 84/56 (10/19 0500) SpO2:  [96 %-99 %] 97 % (10/19 0425) Weight:  [75.9 kg (167 lb 5.3 oz)] 75.9 kg (167 lb 5.3 oz) (10/19 0009)  General: WDWN patient in NAD. Psych:  Appropriate mood and affect. Neuro:  A&O x 3, Moving all extremities, sensation intact to light touch HEENT:  EOMs intact Chest:  Even non-labored respirations Skin:  Cast C/D/I, no rashes or lesions.   Extremities: warm/dry, mild edema, no erythmea or echymosis.  No lymphadenopathy. Pulses:Popliteus 2+ MSK:  ROM: EHL/FHL intact, MMT: patient is able to perform quad set    LABS  Recent Labs  03/31/17 0930  HGB 14.1  WBC 6.9  PLT 273    Recent Labs  03/31/17 0930  NA 135  K 4.3  CL 102  CO2 26  BUN 23*  CREATININE 1.40*  GLUCOSE 97   No results for input(s): LABPT, INR in the last 72 hours.   Assessment/Plan: 1 Day Post-Op Procedure(s) (LRB): RIGHT TOTAL ANKLE ARTHOPLASTY (Right)  NWB R LE Will obtain CBC to check Hgb. Plan for D/C home this evening or tomorrow. Up with therapy. Plan for 3 week outpatient post-op visit with Dr. Victorino DikeHewitt. Scripts on chart.  Alfredo MartinezJustin Marella Vanderpol, PA-C [facility] Office:  [phone]

## 2017-04-03 NOTE — Plan of Care (Signed)
Problem: Pain Managment: Goal: General experience of comfort will improve Patient is able to rate pain on scale of 1-10

## 2017-04-03 NOTE — Progress Notes (Signed)
OT Note - Addendum    04/03/17 1157  OT Visit Information  Last OT Received On 04/03/17  OT Time Calculation  OT Start Time (ACUTE ONLY) 1033  OT Stop Time (ACUTE ONLY) 1112  OT Time Calculation (min) 39 min  OT General Charges  $OT Visit 1 Visit  OT Evaluation  $OT Eval Low Complexity 1 Low  OT Treatments  $Self Care/Home Management  23-37 mins  [facility] Hospital[facility] Curlee Bogan, OT/L  [phone] 04/03/2017

## 2017-04-03 NOTE — Care Management (Signed)
Patient has no Home Health needs, no DME needs. Case manager signed off.

## 2017-04-03 NOTE — Therapy (Signed)
Occupational Therapy Evaluation Patient Details Name: Victor Fuller MRN: [id number] DOB: June 09, 1946 Today's Date: 04/03/2017    History of Present Illness Pt is a 71 y.o. male now s/p elective R total ankle replacement on 04/02/17. Pertinent PMH includes HTN, GERD, COPD, arthritis.    Clinical Impression   Pt reports being independent in ADLs and IADLs PTA. Currently, pt requires supervision during shower transfers for safety and is overall modified independent for all other ADLs and functional mobility with crutches. Pt reports his wife and daughter are available to assist as needed upon d/c home. Pts BP in bed upon arrival was 88/53, HR 64. Pt's BP after activity 113/53, HR 72. Pt asymptomatic during session. Pt received all acute OT education and safely returned demonstration of shower transfer. Pt safe to d/c home when medically stable. No further acute OT needs at this time. OT signing off.     Follow Up Recommendations  DC plan and follow up therapy as arranged by surgeon;Supervision - Intermittent    Equipment Recommendations  None recommended by OT    Recommendations for Other Services       Precautions / Restrictions Precautions Precautions: None Required Braces or Orthoses: Other Brace/Splint Other Brace/Splint: Hard cast on ankle Restrictions Weight Bearing Restrictions: Yes RLE Weight Bearing: Non weight bearing      Mobility Bed Mobility Overal bed mobility: Independent             General bed mobility comments: Pt able to sit EOB with no assistance.   Transfers Overall transfer level: Modified independent Equipment used: None;Crutches             General transfer comment: Pt able to maintain NWB status while standing     Balance Overall balance assessment: Needs assistance Sitting-balance support: No upper extremity supported;Feet unsupported Sitting balance-Leahy Scale: Normal     Standing balance support: Bilateral upper extremity  supported;No upper extremity supported;During functional activity Standing balance-Leahy Scale: Fair Standing balance comment: Able to static stand with no UE support                           ADL either performed or assessed with clinical judgement   ADL Overall ADL's : Needs assistance/impaired Eating/Feeding: Independent   Grooming: Modified independent;Standing (Crutches )   Upper Body Bathing: Modified independent;Sitting   Lower Body Bathing: Modified independent;Sitting/lateral leans   Upper Body Dressing : Modified independent;Sitting   Lower Body Dressing: Modified independent;Sit to/from stand   Toilet Transfer: Modified Independent;Ambulation (Crutches ) Toilet Transfer Details (indicate cue type and reason): Simulated sit to stand from EOB      Tub/ Shower Transfer: Supervision/safety;Ambulation;Tub transfer;Shower seat (Crutches ) Tub/Shower Transfer Details (indicate cue type and reason): Pt able to return demonstration of shower transfer technique while maintaining NWB status.  Functional mobility during ADLs: Modified independent (Crutches) General ADL Comments: Pt educated on compensatory techniques for LB ADLs. Pt reports being able to don LB clothing independently. Pt advised to speak with doctor about showering with cast. Pt advised to keep leg out of shower and not wet until told otherwise by MD.     Vision         Perception     Praxis      Pertinent Vitals/Pain Pain Assessment: No/denies pain Pain Score: 2      Hand Dominance Right   Extremity/Trunk Assessment Upper Extremity Assessment Upper Extremity Assessment: Overall WFL for tasks assessed   Lower Extremity  Assessment Lower Extremity Assessment: Defer to PT evaluation   Cervical / Trunk Assessment Cervical / Trunk Assessment: Normal   Communication Communication Communication: No difficulties   Cognition Arousal/Alertness: Awake/alert Behavior During Therapy: WFL for  tasks assessed/performed Overall Cognitive Status: Within Functional Limits for tasks assessed                                     General Comments  Pts BP in bed upon arrival 88/53, HR 64. Pt's BP after activity 113/53, HR 72. Pt asymptomatic during session.    Exercises     Shoulder Instructions      Home Living Family/patient expects to be discharged to:: Private residence Living Arrangements: Spouse/significant other;Children Available Help at Discharge: Family Type of Home: House Home Access: Stairs to enter Secretary/administratorntrance Stairs-Number of Steps: 4 Entrance Stairs-Rails: Right;Left Home Layout: One level     Bathroom Shower/Tub: Tub/shower unit;Door   Foot LockerBathroom Toilet: Standard Bathroom Accessibility: Yes How Accessible: Accessible via walker Home Equipment: Walker - standard;Crutches;Bedside commode          Prior Functioning/Environment Level of Independence: Independent        Comments: Pt enjoys working on cars         OT Problem List: Decreased knowledge of use of DME or AE;Decreased knowledge of precautions      OT Treatment/Interventions: Self-care/ADL training;Therapeutic activities    OT Goals(Current goals can be found in the care plan section) Acute Rehab OT Goals Patient Stated Goal: Return home OT Goal Formulation: All assessment and education complete, DC therapy  OT Frequency:     Barriers to D/C:            Co-evaluation              AM-PAC PT "6 Clicks" Daily Activity     Outcome Measure Help from another person eating meals?: None Help from another person taking care of personal grooming?: None Help from another person toileting, which includes using toliet, bedpan, or urinal?: None Help from another person bathing (including washing, rinsing, drying)?: None Help from another person to put on and taking off regular upper body clothing?: None Help from another person to put on and taking off regular lower body  clothing?: None 6 Click Score: 24   End of Session Equipment Utilized During Treatment: Gait belt;Other (comment) (Crutches ) Nurse Communication: Mobility status;Other (comment) (BP)  Activity Tolerance: Patient tolerated treatment well Patient left: in chair;with call bell/phone within reach  OT Visit Diagnosis: Other abnormalities of gait and mobility (R26.89)                Time: [id number] OT Time Calculation (min): 39 min Charges:    G-Codes:     Victor Fuller, OTS [phone]   Victor Fuller 04/03/2017, 11:31 AM

## 2017-04-03 NOTE — Progress Notes (Signed)
RN called for SBP 80-90's, pt asymptomatic, alert and oriented x4, denies feeling lightheaded. 500 cc bolus NS given.  0630 RN called to report pt's SBP 90, remains asymptomatic. Advised to call MD

## 2017-04-03 NOTE — Discharge Summary (Signed)
Physician Discharge Summary  Patient ID: Victor Fuller MRN: [id number] DOB/AGE: [birth year] 71 y.o.  Admit date: 04/02/2017 Discharge date: 04/03/2017  Admission Diagnoses: right ankle osteoarthritis; chronic LBP; COPD; GERD; hx of gout; HTN; hx of pneumonia  Discharge Diagnoses:  Active Problems:   Hx of total ankle replacement, right same as above  Discharged Condition: stable  Hospital Course: Patient reported to the [facility] OR on 04/02/17 for elective R total ankle replacement by Dr. Toni Arthurs.  The patient tolerated the procedure well without any complications. The patient was then admitted to the hospital.  He tolerated his stay well working with therarpy.  He did have an asymptomatic episode of hypotension during the night.  CBC was obtained that demonstrated a Hgb of 11.4.  The patient was D/C'd home on 04/03/17.  Consults: PT/OT/case management  Significant Diagnostic Studies: radiology: X-Ray: to ensure satisfactory anatomic alignement during operative procedure.  CBC to ensure no evidence of acute blood loss anemia.  Treatments: IV hydration; ABX, Ancef; Anticoagulants, Lovenox; Pain control, Tylenol, etodolac, oxycodone, morphine; Surgical Procedure, as stated above.  Discharge Exam: Blood pressure (!) 84/56, pulse 72, temperature 98.3 F (36.8 C), temperature source Oral, resp. rate 17, height 5\' 4"  (1.626 m), weight 75.9 kg (167 lb 5.3 oz), SpO2 97 %. General: WDWN patient in NAD. Psych:  Appropriate mood and affect. Neuro:  A&O x 3, Moving all extremities, sensation intact to light touch HEENT:  EOMs intact Chest:  Even non-labored respirations Skin:  Cast C/D/I, no rashes or lesions Extremities: warm/dry, mild edema, no erythema or echymosis.  No lymphadenopathy. Pulses: Popliteus 2+ MSK:  ROM: EHL/FHL intact, MMT: patient is able to perform quad set   Disposition: 81-Discharged to home/self-care with a planned acute care hospital inpt  readmission  Discharge Instructions    Call MD / Call 911    Complete by:  As directed    If you experience chest pain or shortness of breath, CALL 911 and be transported to the hospital emergency room.  If you develope a fever above 101 F, pus (white drainage) or increased drainage or redness at the wound, or calf pain, call your surgeon's office.   Constipation Prevention    Complete by:  As directed    Drink plenty of fluids.  Prune juice may be helpful.  You may use a stool softener, such as Colace (over the counter) 100 mg twice a day.  Use MiraLax (over the counter) for constipation as needed.   Diet - low sodium heart healthy    Complete by:  As directed    Increase activity slowly as tolerated    Complete by:  As directed    Non weight bearing    Complete by:  As directed    Laterality:  right   Extremity:  Lower     Allergies as of 04/03/2017   No Known Allergies     Medication List    TAKE these medications   allopurinol 300 MG tablet Commonly known as:  ZYLOPRIM Take 300 mg by mouth every 3 (three) days.   aspirin EC 81 MG tablet Take 1 tablet (81 mg total) by mouth 2 (two) times daily.   colchicine-probenecid 0.5-500 MG tablet Take 1 tablet by mouth twice daily as needed for gout pain   docusate sodium 100 MG capsule Commonly known as:  COLACE Take 1 capsule (100 mg total) by mouth 2 (two) times daily. While taking narcotic pain medicine.   etodolac 400 MG tablet  Commonly known as:  LODINE Take 400 mg by mouth 2 (two) times daily.   gabapentin 100 MG capsule Commonly known as:  NEURONTIN Take 100 mg by mouth at bedtime.   ibuprofen 200 MG tablet Commonly known as:  ADVIL,MOTRIN Take 200-400 mg by mouth every 6 (six) hours as needed for headache or moderate pain.   lisinopril-hydrochlorothiazide 20-12.5 MG tablet Commonly known as:  PRINZIDE,ZESTORETIC Take 1 tablet by mouth every morning.   pantoprazole 40 MG tablet Commonly known as:   PROTONIX Take 40 mg by mouth daily.   senna 8.6 MG Tabs tablet Commonly known as:  SENOKOT Take 2 tablets (17.2 mg total) by mouth 2 (two) times daily.   traMADol 50 MG tablet Commonly known as:  ULTRAM Take 50 mg by mouth daily as needed for moderate pain.            Discharge Care Instructions        Start     Ordered   04/03/17 0000  Non weight bearing    Question Answer Comment  Laterality right   Extremity Lower      04/03/17 0911     Follow-up Information    [name], John, MD. Schedule an appointment as soon as possible for a visit in 3 weeks.   Specialty:  Orthopedic Surgery Contact information: [address] [place] [address] [phone]           Signed: Joana ReamerJustin Koren Serm[place]heim, PA-C [facility] Office:  [phone]

## 2017-04-06 ENCOUNTER — Encounter (HOSPITAL_COMMUNITY): Payer: Self-pay | Admitting: Orthopedic Surgery

## 2017-05-18 DIAGNOSIS — M19071 Primary osteoarthritis, right ankle and foot: Secondary | ICD-10-CM | POA: Diagnosis not present

## 2017-05-28 DIAGNOSIS — M19071 Primary osteoarthritis, right ankle and foot: Secondary | ICD-10-CM | POA: Diagnosis not present

## 2017-12-07 IMAGING — RF DG C-ARM 61-120 MIN
1 series · 3 of 3 positions shown · non-contrast
Comparison: MRI 05/27/2017

CLINICAL DATA: Arthroplasty.

EXAM:
DG C-ARM 61-120 MIN; RIGHT ANKLE - COMPLETE 3+ VIEW

[Series 1: run · 3 of 3 slices shown]
[im 1/3]
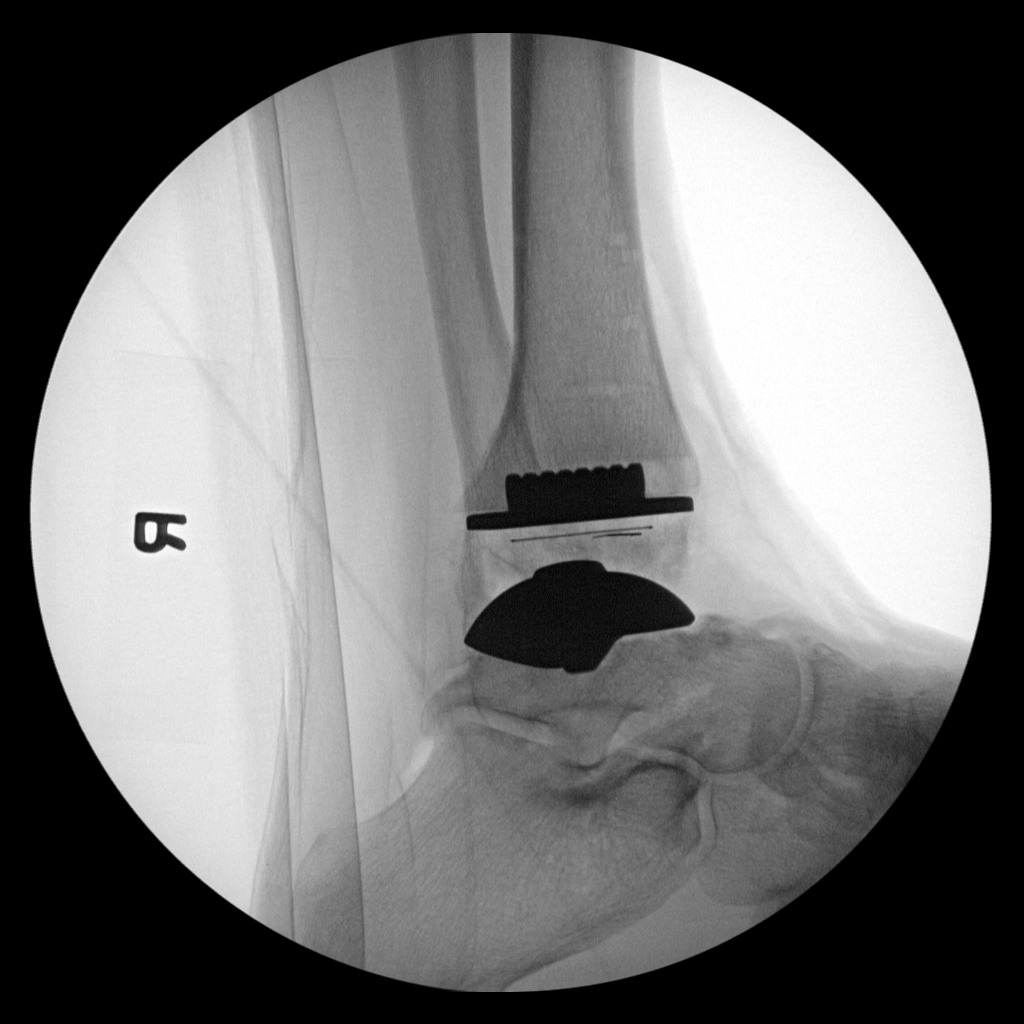
[im 2/3]
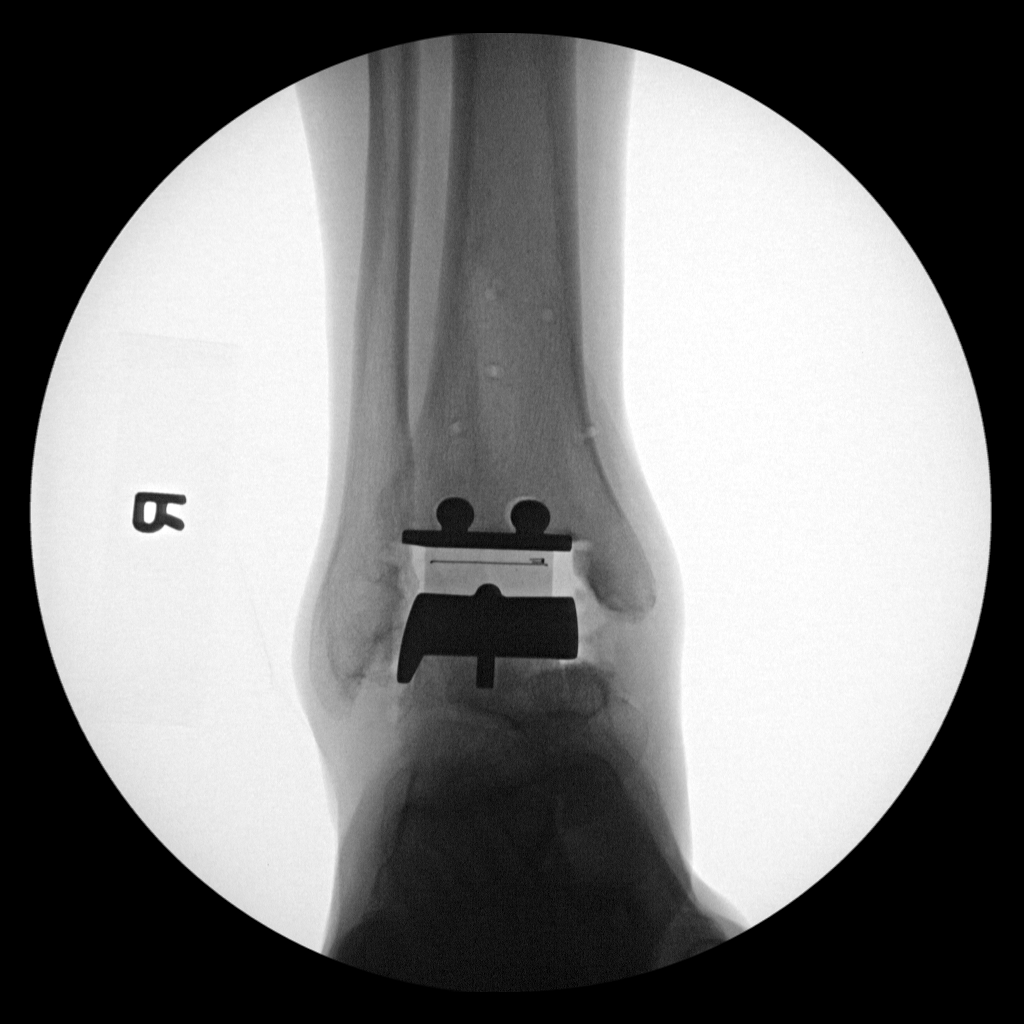
[im 3/3]
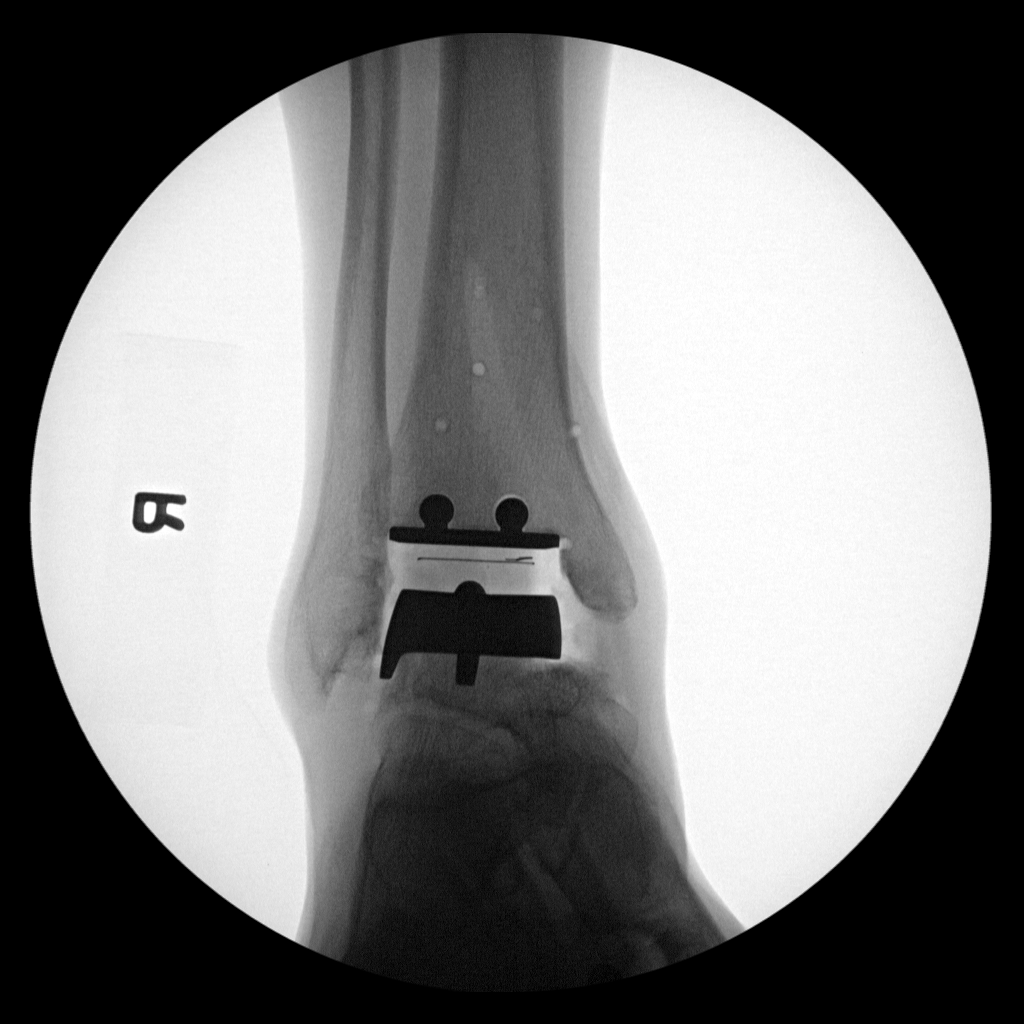

[3 of 3 positions shown; findings below may reference images not displayed]

FINDINGS: Tibial talar joint replacement. Hardware intact. Anatomic alignment.
No acute bony abnormality.
IMPRESSION: Tibiotalar joint replacement with anatomic alignment.

## 2018-01-12 DIAGNOSIS — Z79899 Other long term (current) drug therapy: Secondary | ICD-10-CM | POA: Diagnosis not present

## 2018-01-12 DIAGNOSIS — M109 Gout, unspecified: Secondary | ICD-10-CM | POA: Diagnosis not present

## 2018-01-12 DIAGNOSIS — I10 Essential (primary) hypertension: Secondary | ICD-10-CM | POA: Diagnosis not present

## 2018-01-12 DIAGNOSIS — E559 Vitamin D deficiency, unspecified: Secondary | ICD-10-CM | POA: Diagnosis not present

## 2018-01-12 DIAGNOSIS — E538 Deficiency of other specified B group vitamins: Secondary | ICD-10-CM | POA: Diagnosis not present

## 2018-01-12 DIAGNOSIS — Z1389 Encounter for screening for other disorder: Secondary | ICD-10-CM | POA: Diagnosis not present

## 2018-01-12 DIAGNOSIS — J45909 Unspecified asthma, uncomplicated: Secondary | ICD-10-CM | POA: Diagnosis not present

## 2018-01-12 DIAGNOSIS — Z0000 Encounter for general adult medical examination without abnormal findings: Secondary | ICD-10-CM | POA: Diagnosis not present

## 2018-01-12 DIAGNOSIS — M19071 Primary osteoarthritis, right ankle and foot: Secondary | ICD-10-CM | POA: Diagnosis not present

## 2019-03-23 DIAGNOSIS — Z79899 Other long term (current) drug therapy: Secondary | ICD-10-CM | POA: Diagnosis not present

## 2019-03-23 DIAGNOSIS — J45909 Unspecified asthma, uncomplicated: Secondary | ICD-10-CM | POA: Diagnosis not present

## 2019-03-23 DIAGNOSIS — Z23 Encounter for immunization: Secondary | ICD-10-CM | POA: Diagnosis not present

## 2019-03-23 DIAGNOSIS — Z1389 Encounter for screening for other disorder: Secondary | ICD-10-CM | POA: Diagnosis not present

## 2019-03-23 DIAGNOSIS — E559 Vitamin D deficiency, unspecified: Secondary | ICD-10-CM | POA: Diagnosis not present

## 2019-03-23 DIAGNOSIS — M19071 Primary osteoarthritis, right ankle and foot: Secondary | ICD-10-CM | POA: Diagnosis not present

## 2019-03-23 DIAGNOSIS — I10 Essential (primary) hypertension: Secondary | ICD-10-CM | POA: Diagnosis not present

## 2019-03-23 DIAGNOSIS — Z0000 Encounter for general adult medical examination without abnormal findings: Secondary | ICD-10-CM | POA: Diagnosis not present

## 2019-03-23 DIAGNOSIS — E538 Deficiency of other specified B group vitamins: Secondary | ICD-10-CM | POA: Diagnosis not present

## 2019-03-23 DIAGNOSIS — Z1211 Encounter for screening for malignant neoplasm of colon: Secondary | ICD-10-CM | POA: Diagnosis not present

## 2019-03-23 DIAGNOSIS — M109 Gout, unspecified: Secondary | ICD-10-CM | POA: Diagnosis not present

## 2019-03-23 DIAGNOSIS — Z125 Encounter for screening for malignant neoplasm of prostate: Secondary | ICD-10-CM | POA: Diagnosis not present

## 2019-04-05 DIAGNOSIS — H2512 Age-related nuclear cataract, left eye: Secondary | ICD-10-CM | POA: Diagnosis not present

## 2019-04-05 DIAGNOSIS — H25812 Combined forms of age-related cataract, left eye: Secondary | ICD-10-CM | POA: Diagnosis not present

## 2019-04-05 DIAGNOSIS — H25042 Posterior subcapsular polar age-related cataract, left eye: Secondary | ICD-10-CM | POA: Diagnosis not present

## 2019-09-24 ENCOUNTER — Ambulatory Visit: Payer: PPO | Attending: Internal Medicine

## 2019-09-24 DIAGNOSIS — Z23 Encounter for immunization: Secondary | ICD-10-CM

## 2019-09-24 NOTE — Progress Notes (Signed)
   Covid-19 Vaccination Clinic  Name:  Victor Fuller    MRN: [id number] DOB: 31-Aug-1945  09/24/2019  [name] was observed post Covid-19 immunization for 15 minutes without incident. He was provided with Vaccine Information Sheet and instruction to access the V-Safe system.   [name] was instructed to call 911 with any severe reactions post vaccine: Marland Kitchen Difficulty breathing  . Swelling of face and throat  . A fast heartbeat  . A bad rash all over body  . Dizziness and weakness   Immunizations Administered    Name Date Dose VIS Date Route   Pfizer COVID-19 Vaccine 09/24/2019  4:12 PM 0.3 mL 05/27/2019 Intramuscular   Manufacturer: [employer], [employer]   Lot: [id number]   NDC: [address]-1

## 2019-10-06 DIAGNOSIS — M25562 Pain in left knee: Secondary | ICD-10-CM | POA: Diagnosis not present

## 2019-10-06 DIAGNOSIS — M1712 Unilateral primary osteoarthritis, left knee: Secondary | ICD-10-CM | POA: Diagnosis not present

## 2019-10-17 ENCOUNTER — Ambulatory Visit: Payer: PPO | Attending: Internal Medicine

## 2019-10-17 DIAGNOSIS — Z23 Encounter for immunization: Secondary | ICD-10-CM

## 2019-10-17 NOTE — Progress Notes (Signed)
   Covid-19 Vaccination Clinic  Name:  Victor Fuller    MRN: [id number] DOB: [birth year]  10/17/2019  [name] was observed post Covid-19 immunization for 15 minutes without incident. He was provided with Vaccine Information Sheet and instruction to access the V-Safe system.   [name] was instructed to call 911 with any severe reactions post vaccine: Marland Kitchen Difficulty breathing  . Swelling of face and throat  . A fast heartbeat  . A bad rash all over body  . Dizziness and weakness   Immunizations Administered    Name Date Dose VIS Date Route   Pfizer COVID-19 Vaccine 10/17/2019  3:21 PM 0.3 mL 08/10/2018 Intramuscular   Manufacturer: [employer], [employer]   Lot: [id number]   NDC: [address]-9

## 2020-04-09 DIAGNOSIS — Z79899 Other long term (current) drug therapy: Secondary | ICD-10-CM | POA: Diagnosis not present

## 2020-04-09 DIAGNOSIS — E538 Deficiency of other specified B group vitamins: Secondary | ICD-10-CM | POA: Diagnosis not present

## 2020-04-09 DIAGNOSIS — Z1211 Encounter for screening for malignant neoplasm of colon: Secondary | ICD-10-CM | POA: Diagnosis not present

## 2020-04-09 DIAGNOSIS — Z23 Encounter for immunization: Secondary | ICD-10-CM | POA: Diagnosis not present

## 2020-04-09 DIAGNOSIS — E559 Vitamin D deficiency, unspecified: Secondary | ICD-10-CM | POA: Diagnosis not present

## 2020-04-09 DIAGNOSIS — J310 Chronic rhinitis: Secondary | ICD-10-CM | POA: Diagnosis not present

## 2020-04-09 DIAGNOSIS — M109 Gout, unspecified: Secondary | ICD-10-CM | POA: Diagnosis not present

## 2020-04-09 DIAGNOSIS — Z0000 Encounter for general adult medical examination without abnormal findings: Secondary | ICD-10-CM | POA: Diagnosis not present

## 2020-04-09 DIAGNOSIS — Z1389 Encounter for screening for other disorder: Secondary | ICD-10-CM | POA: Diagnosis not present

## 2020-04-09 DIAGNOSIS — I10 Essential (primary) hypertension: Secondary | ICD-10-CM | POA: Diagnosis not present

## 2020-04-09 DIAGNOSIS — M19071 Primary osteoarthritis, right ankle and foot: Secondary | ICD-10-CM | POA: Diagnosis not present

## 2020-04-09 DIAGNOSIS — J45909 Unspecified asthma, uncomplicated: Secondary | ICD-10-CM | POA: Diagnosis not present

## 2020-05-29 DIAGNOSIS — R109 Unspecified abdominal pain: Secondary | ICD-10-CM | POA: Diagnosis not present

## 2021-01-16 DIAGNOSIS — H52203 Unspecified astigmatism, bilateral: Secondary | ICD-10-CM | POA: Diagnosis not present

## 2021-01-16 DIAGNOSIS — H25041 Posterior subcapsular polar age-related cataract, right eye: Secondary | ICD-10-CM | POA: Diagnosis not present

## 2021-01-16 DIAGNOSIS — H2511 Age-related nuclear cataract, right eye: Secondary | ICD-10-CM | POA: Diagnosis not present

## 2021-01-16 DIAGNOSIS — Z961 Presence of intraocular lens: Secondary | ICD-10-CM | POA: Diagnosis not present

## 2021-01-23 DIAGNOSIS — Z20822 Contact with and (suspected) exposure to covid-19: Secondary | ICD-10-CM | POA: Diagnosis not present

## 2021-02-19 DIAGNOSIS — H268 Other specified cataract: Secondary | ICD-10-CM | POA: Diagnosis not present

## 2021-02-19 DIAGNOSIS — H25041 Posterior subcapsular polar age-related cataract, right eye: Secondary | ICD-10-CM | POA: Diagnosis not present

## 2021-02-19 DIAGNOSIS — H2511 Age-related nuclear cataract, right eye: Secondary | ICD-10-CM | POA: Diagnosis not present

## 2021-02-19 DIAGNOSIS — H25811 Combined forms of age-related cataract, right eye: Secondary | ICD-10-CM | POA: Diagnosis not present

## 2021-04-11 DIAGNOSIS — E559 Vitamin D deficiency, unspecified: Secondary | ICD-10-CM | POA: Diagnosis not present

## 2021-04-11 DIAGNOSIS — Z79899 Other long term (current) drug therapy: Secondary | ICD-10-CM | POA: Diagnosis not present

## 2021-04-11 DIAGNOSIS — M109 Gout, unspecified: Secondary | ICD-10-CM | POA: Diagnosis not present

## 2021-04-11 DIAGNOSIS — Z1211 Encounter for screening for malignant neoplasm of colon: Secondary | ICD-10-CM | POA: Diagnosis not present

## 2021-04-11 DIAGNOSIS — I10 Essential (primary) hypertension: Secondary | ICD-10-CM | POA: Diagnosis not present

## 2021-04-11 DIAGNOSIS — Z23 Encounter for immunization: Secondary | ICD-10-CM | POA: Diagnosis not present

## 2021-04-11 DIAGNOSIS — Z0001 Encounter for general adult medical examination with abnormal findings: Secondary | ICD-10-CM | POA: Diagnosis not present

## 2021-04-11 DIAGNOSIS — E538 Deficiency of other specified B group vitamins: Secondary | ICD-10-CM | POA: Diagnosis not present

## 2021-07-03 DIAGNOSIS — N529 Male erectile dysfunction, unspecified: Secondary | ICD-10-CM | POA: Diagnosis not present

## 2021-07-03 DIAGNOSIS — M1990 Unspecified osteoarthritis, unspecified site: Secondary | ICD-10-CM | POA: Diagnosis not present

## 2021-07-03 DIAGNOSIS — I10 Essential (primary) hypertension: Secondary | ICD-10-CM | POA: Diagnosis not present

## 2021-07-04 DIAGNOSIS — I10 Essential (primary) hypertension: Secondary | ICD-10-CM | POA: Diagnosis not present

## 2021-07-04 DIAGNOSIS — N529 Male erectile dysfunction, unspecified: Secondary | ICD-10-CM | POA: Diagnosis not present

## 2021-07-04 DIAGNOSIS — R822 Biliuria: Secondary | ICD-10-CM | POA: Diagnosis not present

## 2021-10-02 DIAGNOSIS — N5201 Erectile dysfunction due to arterial insufficiency: Secondary | ICD-10-CM | POA: Diagnosis not present

## 2022-01-13 DIAGNOSIS — N5201 Erectile dysfunction due to arterial insufficiency: Secondary | ICD-10-CM | POA: Diagnosis not present

## 2022-02-24 DIAGNOSIS — M25571 Pain in right ankle and joints of right foot: Secondary | ICD-10-CM | POA: Diagnosis not present

## 2022-02-24 DIAGNOSIS — M7661 Achilles tendinitis, right leg: Secondary | ICD-10-CM | POA: Diagnosis not present

## 2022-03-06 DIAGNOSIS — M7661 Achilles tendinitis, right leg: Secondary | ICD-10-CM | POA: Diagnosis not present

## 2022-03-13 DIAGNOSIS — M7661 Achilles tendinitis, right leg: Secondary | ICD-10-CM | POA: Diagnosis not present

## 2022-03-21 DIAGNOSIS — M7661 Achilles tendinitis, right leg: Secondary | ICD-10-CM | POA: Diagnosis not present

## 2022-03-24 DIAGNOSIS — Z961 Presence of intraocular lens: Secondary | ICD-10-CM | POA: Diagnosis not present

## 2022-03-24 DIAGNOSIS — H52203 Unspecified astigmatism, bilateral: Secondary | ICD-10-CM | POA: Diagnosis not present

## 2022-03-27 DIAGNOSIS — M7661 Achilles tendinitis, right leg: Secondary | ICD-10-CM | POA: Diagnosis not present

## 2022-04-21 DIAGNOSIS — M109 Gout, unspecified: Secondary | ICD-10-CM | POA: Diagnosis not present

## 2022-04-21 DIAGNOSIS — I10 Essential (primary) hypertension: Secondary | ICD-10-CM | POA: Diagnosis not present

## 2022-04-21 DIAGNOSIS — Z1331 Encounter for screening for depression: Secondary | ICD-10-CM | POA: Diagnosis not present

## 2022-04-21 DIAGNOSIS — J45909 Unspecified asthma, uncomplicated: Secondary | ICD-10-CM | POA: Diagnosis not present

## 2022-04-21 DIAGNOSIS — Z1211 Encounter for screening for malignant neoplasm of colon: Secondary | ICD-10-CM | POA: Diagnosis not present

## 2022-04-21 DIAGNOSIS — E538 Deficiency of other specified B group vitamins: Secondary | ICD-10-CM | POA: Diagnosis not present

## 2022-04-21 DIAGNOSIS — E559 Vitamin D deficiency, unspecified: Secondary | ICD-10-CM | POA: Diagnosis not present

## 2022-04-21 DIAGNOSIS — Z23 Encounter for immunization: Secondary | ICD-10-CM | POA: Diagnosis not present

## 2022-04-21 DIAGNOSIS — Z79899 Other long term (current) drug therapy: Secondary | ICD-10-CM | POA: Diagnosis not present

## 2022-04-21 DIAGNOSIS — Z0000 Encounter for general adult medical examination without abnormal findings: Secondary | ICD-10-CM | POA: Diagnosis not present

## 2022-05-06 DIAGNOSIS — E871 Hypo-osmolality and hyponatremia: Secondary | ICD-10-CM | POA: Diagnosis not present

## 2022-06-03 DIAGNOSIS — E871 Hypo-osmolality and hyponatremia: Secondary | ICD-10-CM | POA: Diagnosis not present

## 2022-06-03 DIAGNOSIS — J45909 Unspecified asthma, uncomplicated: Secondary | ICD-10-CM | POA: Diagnosis not present

## 2022-09-01 DIAGNOSIS — I10 Essential (primary) hypertension: Secondary | ICD-10-CM | POA: Diagnosis not present

## 2022-09-01 DIAGNOSIS — J209 Acute bronchitis, unspecified: Secondary | ICD-10-CM | POA: Diagnosis not present

## 2022-10-21 DIAGNOSIS — I10 Essential (primary) hypertension: Secondary | ICD-10-CM | POA: Diagnosis not present

## 2022-10-21 DIAGNOSIS — J45909 Unspecified asthma, uncomplicated: Secondary | ICD-10-CM | POA: Diagnosis not present

## 2022-10-21 DIAGNOSIS — Z0000 Encounter for general adult medical examination without abnormal findings: Secondary | ICD-10-CM | POA: Diagnosis not present

## 2022-11-11 DIAGNOSIS — I10 Essential (primary) hypertension: Secondary | ICD-10-CM | POA: Diagnosis not present

## 2022-12-01 ENCOUNTER — Other Ambulatory Visit: Payer: Self-pay

## 2022-12-01 DIAGNOSIS — R0602 Shortness of breath: Secondary | ICD-10-CM

## 2022-12-02 ENCOUNTER — Encounter (HOSPITAL_BASED_OUTPATIENT_CLINIC_OR_DEPARTMENT_OTHER): Payer: PPO

## 2022-12-02 ENCOUNTER — Ambulatory Visit: Payer: Medicare HMO | Admitting: Pulmonary Disease

## 2022-12-02 DIAGNOSIS — R0602 Shortness of breath: Secondary | ICD-10-CM

## 2022-12-02 LAB — PULMONARY FUNCTION TEST
DL/VA % pred: 127 %
DL/VA: 5.13 ml/min/mmHg/L
DLCO cor % pred: 107 %
DLCO cor: 23.57 ml/min/mmHg
DLCO unc % pred: 107 %
DLCO unc: 23.57 ml/min/mmHg
FEF 25-75 Post: 2.04 L/sec
FEF 25-75 Pre: 1.2 L/sec
FEF2575-%Change-Post: 69 %
FEF2575-%Pred-Post: 112 %
FEF2575-%Pred-Pre: 66 %
FEV1-%Change-Post: 21 %
FEV1-%Pred-Post: 78 %
FEV1-%Pred-Pre: 64 %
FEV1-Post: 1.99 L
FEV1-Pre: 1.64 L
FEV1FVC-%Change-Post: 0 %
FEV1FVC-%Pred-Pre: 98 %
FEV6-%Change-Post: 21 %
FEV6-%Pred-Post: 85 %
FEV6-%Pred-Pre: 70 %
FEV6-Post: 2.8 L
FEV6-Pre: 2.31 L
FEV6FVC-%Pred-Post: 107 %
FEV6FVC-%Pred-Pre: 107 %
FVC-%Change-Post: 21 %
FVC-%Pred-Post: 79 %
FVC-%Pred-Pre: 65 %
FVC-Post: 2.8 L
FVC-Pre: 2.31 L
Post FEV1/FVC ratio: 71 %
Post FEV6/FVC ratio: 100 %
Pre FEV1/FVC ratio: 71 %
Pre FEV6/FVC Ratio: 100 %
RV % pred: 109 %
RV: 2.57 L
TLC % pred: 82 %
TLC: 5.17 L

## 2022-12-02 NOTE — Progress Notes (Signed)
Full PFT performed today. °

## 2022-12-02 NOTE — Patient Instructions (Signed)
Full PFT performed today. °

## 2023-03-26 DIAGNOSIS — T2012XA Burn of first degree of lip(s), initial encounter: Secondary | ICD-10-CM | POA: Diagnosis not present

## 2023-10-26 DIAGNOSIS — G8929 Other chronic pain: Secondary | ICD-10-CM | POA: Diagnosis not present

## 2023-10-26 DIAGNOSIS — J45909 Unspecified asthma, uncomplicated: Secondary | ICD-10-CM | POA: Diagnosis not present

## 2023-10-26 DIAGNOSIS — Z1211 Encounter for screening for malignant neoplasm of colon: Secondary | ICD-10-CM | POA: Diagnosis not present

## 2023-10-26 DIAGNOSIS — M109 Gout, unspecified: Secondary | ICD-10-CM | POA: Diagnosis not present

## 2023-10-26 DIAGNOSIS — Z0000 Encounter for general adult medical examination without abnormal findings: Secondary | ICD-10-CM | POA: Diagnosis not present

## 2023-10-26 DIAGNOSIS — I10 Essential (primary) hypertension: Secondary | ICD-10-CM | POA: Diagnosis not present

## 2023-10-26 DIAGNOSIS — E559 Vitamin D deficiency, unspecified: Secondary | ICD-10-CM | POA: Diagnosis not present

## 2023-10-26 DIAGNOSIS — Z23 Encounter for immunization: Secondary | ICD-10-CM | POA: Diagnosis not present

## 2023-10-26 DIAGNOSIS — M25511 Pain in right shoulder: Secondary | ICD-10-CM | POA: Diagnosis not present

## 2023-10-26 DIAGNOSIS — E538 Deficiency of other specified B group vitamins: Secondary | ICD-10-CM | POA: Diagnosis not present

## 2023-10-26 DIAGNOSIS — Z79899 Other long term (current) drug therapy: Secondary | ICD-10-CM | POA: Diagnosis not present

## 2023-10-26 DIAGNOSIS — Z1331 Encounter for screening for depression: Secondary | ICD-10-CM | POA: Diagnosis not present

## 2023-10-30 DIAGNOSIS — M19011 Primary osteoarthritis, right shoulder: Secondary | ICD-10-CM | POA: Diagnosis not present

## 2023-11-18 DIAGNOSIS — T2612XA Burn of cornea and conjunctival sac, left eye, initial encounter: Secondary | ICD-10-CM | POA: Diagnosis not present

## 2023-11-18 DIAGNOSIS — T2602XA Burn of left eyelid and periocular area, initial encounter: Secondary | ICD-10-CM | POA: Diagnosis not present

## 2023-11-19 DIAGNOSIS — T2602XD Burn of left eyelid and periocular area, subsequent encounter: Secondary | ICD-10-CM | POA: Diagnosis not present

## 2023-11-19 DIAGNOSIS — T2612XD Burn of cornea and conjunctival sac, left eye, subsequent encounter: Secondary | ICD-10-CM | POA: Diagnosis not present

## 2023-11-27 DIAGNOSIS — T2612XD Burn of cornea and conjunctival sac, left eye, subsequent encounter: Secondary | ICD-10-CM | POA: Diagnosis not present

## 2023-11-27 DIAGNOSIS — T2602XD Burn of left eyelid and periocular area, subsequent encounter: Secondary | ICD-10-CM | POA: Diagnosis not present

## 2024-01-29 DIAGNOSIS — Z961 Presence of intraocular lens: Secondary | ICD-10-CM | POA: Diagnosis not present

## 2024-01-29 DIAGNOSIS — H52203 Unspecified astigmatism, bilateral: Secondary | ICD-10-CM | POA: Diagnosis not present

## 2024-02-01 ENCOUNTER — Encounter: Payer: Self-pay | Admitting: Orthopedic Surgery

## 2024-02-01 ENCOUNTER — Other Ambulatory Visit: Payer: Self-pay | Admitting: Orthopedic Surgery

## 2024-02-01 DIAGNOSIS — Z01818 Encounter for other preprocedural examination: Secondary | ICD-10-CM

## 2024-02-01 DIAGNOSIS — S46011A Strain of muscle(s) and tendon(s) of the rotator cuff of right shoulder, initial encounter: Secondary | ICD-10-CM | POA: Diagnosis not present

## 2024-02-01 DIAGNOSIS — G8929 Other chronic pain: Secondary | ICD-10-CM

## 2024-02-02 ENCOUNTER — Ambulatory Visit
Admission: RE | Admit: 2024-02-02 | Discharge: 2024-02-02 | Disposition: A | Discharge status: Home / Self Care | Source: Ambulatory Visit | Attending: Orthopedic Surgery | Admitting: Orthopedic Surgery

## 2024-02-02 DIAGNOSIS — G8929 Other chronic pain: Secondary | ICD-10-CM

## 2024-02-02 DIAGNOSIS — Z01818 Encounter for other preprocedural examination: Secondary | ICD-10-CM

## 2024-02-02 DIAGNOSIS — M19011 Primary osteoarthritis, right shoulder: Secondary | ICD-10-CM | POA: Diagnosis not present

## 2024-03-23 DIAGNOSIS — I10 Essential (primary) hypertension: Secondary | ICD-10-CM | POA: Diagnosis not present

## 2024-03-23 DIAGNOSIS — Z791 Long term (current) use of non-steroidal anti-inflammatories (NSAID): Secondary | ICD-10-CM | POA: Diagnosis not present

## 2024-03-23 DIAGNOSIS — M1990 Unspecified osteoarthritis, unspecified site: Secondary | ICD-10-CM | POA: Diagnosis not present

## 2024-03-23 DIAGNOSIS — J449 Chronic obstructive pulmonary disease, unspecified: Secondary | ICD-10-CM | POA: Diagnosis not present

## 2024-03-23 DIAGNOSIS — Z8249 Family history of ischemic heart disease and other diseases of the circulatory system: Secondary | ICD-10-CM | POA: Diagnosis not present

## 2024-03-23 DIAGNOSIS — Z809 Family history of malignant neoplasm, unspecified: Secondary | ICD-10-CM | POA: Diagnosis not present

## 2024-03-23 DIAGNOSIS — Z87891 Personal history of nicotine dependence: Secondary | ICD-10-CM | POA: Diagnosis not present

## 2024-04-28 DIAGNOSIS — M25511 Pain in right shoulder: Secondary | ICD-10-CM | POA: Diagnosis not present

## 2024-04-28 DIAGNOSIS — I10 Essential (primary) hypertension: Secondary | ICD-10-CM | POA: Diagnosis not present

## 2024-04-28 DIAGNOSIS — Z23 Encounter for immunization: Secondary | ICD-10-CM | POA: Diagnosis not present

## 2024-04-28 DIAGNOSIS — J45909 Unspecified asthma, uncomplicated: Secondary | ICD-10-CM | POA: Diagnosis not present
